# Patient Record
Sex: Female | Born: 1950 | Race: White | Hispanic: No | Smoking: Former smoker
Health system: Southern US, Community
[De-identification: ages and names within clinical notes are randomized; demographics above are authoritative.]

## PROBLEM LIST (undated history)

## (undated) DIAGNOSIS — E119 Type 2 diabetes mellitus without complications: Secondary | ICD-10-CM

## (undated) DIAGNOSIS — E1010 Type 1 diabetes mellitus with ketoacidosis without coma: Secondary | ICD-10-CM

## (undated) DIAGNOSIS — J45909 Unspecified asthma, uncomplicated: Secondary | ICD-10-CM

## (undated) DIAGNOSIS — Z853 Personal history of malignant neoplasm of breast: Secondary | ICD-10-CM

## (undated) DIAGNOSIS — E039 Hypothyroidism, unspecified: Secondary | ICD-10-CM

## (undated) DIAGNOSIS — U071 COVID-19: Secondary | ICD-10-CM

## (undated) DIAGNOSIS — E785 Hyperlipidemia, unspecified: Secondary | ICD-10-CM

## (undated) DIAGNOSIS — N200 Calculus of kidney: Secondary | ICD-10-CM

## (undated) DIAGNOSIS — I10 Essential (primary) hypertension: Secondary | ICD-10-CM

## (undated) DIAGNOSIS — J449 Chronic obstructive pulmonary disease, unspecified: Secondary | ICD-10-CM

## (undated) HISTORY — DX: Type 2 diabetes mellitus without complications: E11.9

## (undated) HISTORY — PX: ABDOMINAL HYSTERECTOMY: SHX81

## (undated) HISTORY — DX: Calculus of kidney: N20.0

## (undated) HISTORY — DX: Hyperlipidemia, unspecified: E78.5

## (undated) HISTORY — PX: PARTIAL HYSTERECTOMY: SHX80

## (undated) HISTORY — PX: INNER EAR SURGERY: SHX679

## (undated) HISTORY — DX: COVID-19: U07.1

---

## 2019-02-14 NOTE — H&P (Signed)
TOTAL HIP ADMISSION H&P  Patient is admitted for right total hip arthroplasty, anterior approach.  Subjective:  Chief Complaint:   Right hip primary OA / pain  HPI: Mandy Spears, 68 y.o. female, has a history of pain and functional disability in the right hip(s) due to arthritis and patient has failed non-surgical conservative treatments for greater than 12 weeks to include NSAID's and/or analgesics, corticosteriod injections and activity modification.  Onset of symptoms was gradual starting 1+ years ago with gradually worsening course since that time.The patient noted no past surgery on the right hip(s).  Patient currently rates pain in the right hip at 8 out of 10 with activity. Patient has worsening of pain with activity and weight bearing, trendelenberg gait, pain that interfers with activities of daily living and pain with passive range of motion. Patient has evidence of periarticular osteophytes and joint space narrowing by imaging studies. This condition presents safety issues increasing the risk of falls.  There is no current active infection.  Risks, benefits and expectations were discussed with the patient.  Risks including but not limited to the risk of anesthesia, blood clots, nerve damage, blood vessel damage, failure of the prosthesis, infection and up to and including death.  Patient understand the risks, benefits and expectations and wishes to proceed with surgery.   PCP: Charlynn Court, NP  D/C Plans:       Home   Post-op Meds:       No Rx given   Tranexamic Acid:      To be given - IV   Decadron:      Is to be given  FYI:      ASA  Norco  DME:   Pt equipment  - RW  & 3-n-1  PT:   HEP  Pharmacy: [facility] [place]     Past Medical History:  Diagnosis Date  . Asthma   . COPD (chronic obstructive pulmonary disease) ([place])   . Hypertension   . Hypothyroidism     Past Surgical History:  Procedure Laterality Date  . ABDOMINAL HYSTERECTOMY      No current  facility-administered medications for this encounter.    Current Outpatient Medications  Medication Sig Dispense Refill Last Dose  . albuterol (VENTOLIN HFA) 108 (90 Base) MCG/ACT inhaler Inhale 1-2 puffs into the lungs every 6 (six) hours as needed for wheezing or shortness of breath.     Marland Kitchen atenolol (TENORMIN) 50 MG tablet Take 100 mg by mouth daily.     Marland Kitchen levothyroxine (SYNTHROID) 88 MCG tablet Take 88 mcg by mouth daily at 6 (six) AM.      . losartan-hydrochlorothiazide (HYZAAR) 50-12.5 MG tablet Take 1 tablet by mouth daily.     . meloxicam (MOBIC) 15 MG tablet Take 15 mg by mouth daily.     . pravastatin (PRAVACHOL) 80 MG tablet Take 80 mg by mouth daily.      Allergies  Allergen Reactions  . Fish Oil Nausea And Vomiting     Social History   Tobacco Use  . Smoking status: Former Smoker    Packs/day: 2.00    Years: 15.00    Pack years: 30.00    Types: Cigarettes    Quit date: 12/29/2017    Years since quitting: 1.1  . Smokeless tobacco: Never Used  Substance Use Topics  . Alcohol use: Never    Frequency: Never       Review of Systems  Constitutional: Negative.   HENT: Negative.  Eyes: Negative.   Respiratory: Positive for shortness of breath (with exertion).   Cardiovascular: Negative.   Gastrointestinal: Negative.   Genitourinary: Positive for frequency.  Musculoskeletal: Positive for joint pain.  Skin: Negative.   Neurological: Negative.   Endo/Heme/Allergies: Negative.   Psychiatric/Behavioral: Negative.     Objective:  Physical Exam  Constitutional: She is oriented to person, place, and time. She appears well-developed.  HENT:  Head: Normocephalic.  Eyes: Pupils are equal, round, and reactive to light.  Neck: Neck supple. No JVD present. No tracheal deviation present. No thyromegaly present.  Cardiovascular: Normal rate, regular rhythm and intact distal pulses.  Respiratory: Effort normal and breath sounds normal. No respiratory distress. She has no  wheezes.  GI: Soft. There is no abdominal tenderness. There is no guarding.  Musculoskeletal:     Right hip: She exhibits decreased range of motion, decreased strength, tenderness and bony tenderness. She exhibits no swelling, no deformity and no laceration.  Lymphadenopathy:    She has no cervical adenopathy.  Neurological: She is alert and oriented to person, place, and time.  Skin: Skin is warm and dry.  Psychiatric: She has a normal mood and affect.      Imaging Review Plain radiographs demonstrate severe degenerative joint disease of the right hip(s). The bone quality appears to be good for age and reported activity level.      Assessment/Plan:  End stage arthritis, right hip(s)  The patient history, physical examination, clinical judgement of the provider and imaging studies are consistent with end stage degenerative joint disease of the right hip(s) and total hip arthroplasty is deemed medically necessary. The treatment options including medical management, injection therapy, arthroscopy and arthroplasty were discussed at length. The risks and benefits of total hip arthroplasty were presented and reviewed. The risks due to aseptic loosening, infection, stiffness, dislocation/subluxation,  thromboembolic complications and other imponderables were discussed.  The patient acknowledged the explanation, agreed to proceed with the plan and consent was signed. Patient is being admitted for inpatient treatment for surgery, pain control, PT, OT, prophylactic antibiotics, VTE prophylaxis, progressive ambulation and ADL's and discharge planning.The patient is planning to be discharged home.     Anastasio AuerbachMatthew S. Miata Culbreth   PA-C  02/24/2019, 8:36 AM

## 2019-02-20 ENCOUNTER — Inpatient Hospital Stay (HOSPITAL_COMMUNITY): Admission: RE | Admit: 2019-02-20 | Payer: Self-pay | Source: Ambulatory Visit

## 2019-02-22 ENCOUNTER — Encounter (HOSPITAL_COMMUNITY): Payer: Self-pay

## 2019-02-22 ENCOUNTER — Other Ambulatory Visit (HOSPITAL_COMMUNITY)
Admission: RE | Admit: 2019-02-22 | Discharge: 2019-02-22 | Disposition: A | Discharge status: Home / Self Care | Payer: Medicare Other | Source: Ambulatory Visit | Attending: Orthopedic Surgery | Admitting: Orthopedic Surgery

## 2019-02-22 DIAGNOSIS — Z01812 Encounter for preprocedural laboratory examination: Secondary | ICD-10-CM | POA: Insufficient documentation

## 2019-02-22 DIAGNOSIS — M1611 Unilateral primary osteoarthritis, right hip: Secondary | ICD-10-CM | POA: Insufficient documentation

## 2019-02-22 DIAGNOSIS — Z20828 Contact with and (suspected) exposure to other viral communicable diseases: Secondary | ICD-10-CM | POA: Diagnosis not present

## 2019-02-22 LAB — SARS CORONAVIRUS 2 (TAT 6-24 HRS): SARS Coronavirus 2: NEGATIVE

## 2019-02-22 NOTE — Patient Instructions (Addendum)
ONCE YOUR COVID TEST IS COMPLETED, PLEASE BEGIN THE QUARANTINE INSTRUCTIONS AS OUTLINED IN YOUR HANDOUT.                Mandy Spears    Your procedure is scheduled on: Thursday 02/25/19   Report to [facility] Main  Entrance  Report to admitting at 6:07 AM   1 VISITOR IS ALLOWED TO WAIT IN WAITING ROOM  ONLY DAY OF YOUR SURGERY.  NO VISITORS ARE ALLOWED IN SHORT STAY OR RECOVERY ROOM.   Call this number if you have problems the morning of surgery [phone]    Mandy Spears, NO CHEWING GUM CANDY OR MINTS   Do not eat food After Midnight.  YOU MAY HAVE CLEAR LIQUIDS FROM MIDNIGHT UNTIL 4:30AM.  At 4:30AM Please finish the prescribed Pre-Surgery Gatorade drink. Nothing by mouth after you finish the Gatorade drink !   Take these medicines the morning of surgery with A SIP OF WATER: Atenolol., Levothyroxine,  use Albuterol inhaler if needed and bring with you to the hospital                                You may not have any metal on your body including hair pins and              piercings              Do not wear jewelry, make-up, lotions, powders or perfumes, deodorant             Do not wear nail polish.  Do not shave  48 hours prior to surgery.              Do not bring valuables to the hospital. Mandy Spears.  Contacts, dentures or bridgework may not be worn into surgery.                    Please read over the following fact sheets you were given: _____________________________________________________________________             [facility] - Preparing for Surgery Before surgery, you can play an important role.   Because skin is not sterile, your skin needs to be as free of germs as possible.   You can reduce the number of germs on your skin by washing with CHG (chlorahexidine gluconate) soap before surgery.   CHG is an antiseptic cleaner which kills germs and  bonds with the skin to continue killing germs even after washing. Please DO NOT use if you have an allergy to CHG or antibacterial soaps.   If your skin becomes reddened/irritated stop using the CHG and inform your nurse when you arrive at Short Stay. Do not shave (including legs and underarms) for at least 48 hours prior to the first CHG shower.   Please follow these instructions carefully:  1.  Shower with CHG Soap the night before surgery and the  morning of Surgery.  2.  If you choose to wash your hair, wash your hair first as usual with your  normal  shampoo.  3.  After you shampoo, rinse your hair and body thoroughly to remove the  shampoo.  4.  Use CHG as you would any other liquid soap.  You can apply chg directly  to the skin and wash                       Gently with a scrungie or clean washcloth.  5.  Apply the CHG Soap to your body ONLY FROM THE NECK DOWN.   Do not use on face/ open                           Wound or open sores. Avoid contact with eyes, ears mouth and genitals (private parts).                       Wash face,  Genitals (private parts) with your normal soap.             6.  Wash thoroughly, paying special attention to the area where your surgery  will be performed.  7.  Thoroughly rinse your body with warm water from the neck down.  8.  DO NOT shower/wash with your normal soap after using and rinsing off  the CHG Soap.             9.  Pat yourself dry with a clean towel.            10.  Wear clean pajamas.            11.  Place clean sheets on your bed the night of your first shower and do not  sleep with pets. Day of Surgery : Do not apply any lotions/deodorants the morning of surgery.  Please wear clean clothes to the hospital/surgery center.  FAILURE TO FOLLOW THESE INSTRUCTIONS MAY RESULT IN THE CANCELLATION OF YOUR SURGERY PATIENT SIGNATURE_________________________________  NURSE  SIGNATURE__________________________________  ________________________________________________________________________   Mandy Spears  An incentive spirometer is a tool that can help keep your lungs clear and active. This tool measures how well you are filling your lungs with each breath. Taking long deep breaths may help reverse or decrease the chance of developing breathing (pulmonary) problems (especially infection) following:  A long period of time when you are unable to move or be active. BEFORE THE PROCEDURE   If the spirometer includes an indicator to show your best effort, your nurse or respiratory therapist will set it to a desired goal.  If possible, sit up straight or lean slightly forward. Try not to slouch.  Hold the incentive spirometer in an upright position. INSTRUCTIONS FOR USE  1. Sit on the edge of your bed if possible, or sit up as far as you can in bed or on a chair. 2. Hold the incentive spirometer in an upright position. 3. Breathe out normally. 4. Place the mouthpiece in your mouth and seal your lips tightly around it. 5. Breathe in slowly and as deeply as possible, raising the piston or the ball toward the top of the column. 6. Hold your breath for 3-5 seconds or for as long as possible. Allow the piston or ball to fall to the bottom of the column. 7. Remove the mouthpiece from your mouth and breathe out normally. 8. Rest for a few seconds and repeat Steps 1 through 7 at least 10 times every 1-2 hours when you are awake. Take your time and take a few normal breaths between deep breaths. 9. The spirometer may include an indicator to show your best effort.  Use the indicator as a goal to work toward during each repetition. 10. After each set of 10 deep breaths, practice coughing to be sure your lungs are clear. If you have an incision (the cut made at the time of surgery), support your incision when coughing by placing a pillow or rolled up towels firmly  against it. Once you are able to get out of bed, walk around indoors and cough well. You may stop using the incentive spirometer when instructed by your caregiver.  RISKS AND COMPLICATIONS  Take your time so you do not get dizzy or light-headed.  If you are in pain, you may need to take or ask for pain medication before doing incentive spirometry. It is harder to take a deep breath if you are having pain. AFTER USE  Rest and breathe slowly and easily.  It can be helpful to keep track of a log of your progress. Your caregiver can provide you with a simple table to help with this. If you are using the spirometer at home, follow these instructions: [place] IF:   You are having difficultly using the spirometer.  You have trouble using the spirometer as often as instructed.  Your pain medication is not giving enough relief while using the spirometer.  You develop fever of 100.5 F (38.1 C) or higher. SEEK IMMEDIATE MEDICAL CARE IF:   You cough up bloody sputum that had not been present before.  You develop fever of 102 F (38.9 C) or greater.  You develop worsening pain at or near the incision site. MAKE SURE YOU:   Understand these instructions.  Will watch your condition.  Will get help right away if you are not doing well or get worse. Document Released: 10/28/2006 Document Revised: 09/09/2011 Document Reviewed: 12/29/2006 [facility] Patient Information 2014 [place], [place].   ________________________________________________________________________

## 2019-02-23 ENCOUNTER — Encounter (HOSPITAL_COMMUNITY)
Admission: RE | Admit: 2019-02-23 | Discharge: 2019-02-23 | Disposition: A | Discharge status: Home / Self Care | Payer: Medicare Other | Source: Ambulatory Visit | Attending: Orthopedic Surgery | Admitting: Orthopedic Surgery

## 2019-02-23 ENCOUNTER — Encounter (HOSPITAL_COMMUNITY): Payer: Self-pay

## 2019-02-23 ENCOUNTER — Other Ambulatory Visit: Payer: Self-pay

## 2019-02-23 DIAGNOSIS — Z791 Long term (current) use of non-steroidal anti-inflammatories (NSAID): Secondary | ICD-10-CM | POA: Diagnosis not present

## 2019-02-23 DIAGNOSIS — I10 Essential (primary) hypertension: Secondary | ICD-10-CM | POA: Insufficient documentation

## 2019-02-23 DIAGNOSIS — Z79890 Hormone replacement therapy (postmenopausal): Secondary | ICD-10-CM | POA: Diagnosis not present

## 2019-02-23 DIAGNOSIS — E039 Hypothyroidism, unspecified: Secondary | ICD-10-CM | POA: Diagnosis not present

## 2019-02-23 DIAGNOSIS — Z87891 Personal history of nicotine dependence: Secondary | ICD-10-CM | POA: Diagnosis not present

## 2019-02-23 DIAGNOSIS — J449 Chronic obstructive pulmonary disease, unspecified: Secondary | ICD-10-CM | POA: Diagnosis not present

## 2019-02-23 DIAGNOSIS — Z79899 Other long term (current) drug therapy: Secondary | ICD-10-CM | POA: Diagnosis not present

## 2019-02-23 DIAGNOSIS — M1611 Unilateral primary osteoarthritis, right hip: Secondary | ICD-10-CM | POA: Insufficient documentation

## 2019-02-23 DIAGNOSIS — M25751 Osteophyte, right hip: Secondary | ICD-10-CM | POA: Diagnosis not present

## 2019-02-23 DIAGNOSIS — Z01812 Encounter for preprocedural laboratory examination: Secondary | ICD-10-CM | POA: Insufficient documentation

## 2019-02-23 HISTORY — DX: Hypothyroidism, unspecified: E03.9

## 2019-02-23 HISTORY — DX: Essential (primary) hypertension: I10

## 2019-02-23 HISTORY — DX: Unspecified asthma, uncomplicated: J45.909

## 2019-02-23 HISTORY — DX: Chronic obstructive pulmonary disease, unspecified: J44.9

## 2019-02-23 LAB — BASIC METABOLIC PANEL
Anion gap: 10 (ref 5–15)
BUN: 23 mg/dL (ref 8–23)
CO2: 26 mmol/L (ref 22–32)
Calcium: 9.1 mg/dL (ref 8.9–10.3)
Chloride: 103 mmol/L (ref 98–111)
Creatinine, Ser: 1.06 mg/dL — ABNORMAL HIGH (ref 0.44–1.00)
GFR calc Af Amer: >60 mL/min (ref >60)
GFR calc non Af Amer: 54 mL/min — ABNORMAL LOW (ref >60)
Glucose, Bld: 97 mg/dL (ref 70–99)
Potassium: 3.9 mmol/L (ref 3.5–5.1)
Sodium: 139 mmol/L (ref 135–145)

## 2019-02-23 LAB — SURGICAL PCR SCREEN
MRSA, PCR: NEGATIVE
Staphylococcus aureus: POSITIVE — AB

## 2019-02-23 LAB — ABO/RH: ABO/RH(D): A POS

## 2019-02-23 NOTE — Progress Notes (Signed)
PCP - Mertha Baars Cardiologist - none  Chest x-ray - no EKG - req Stress Test -  ECHO -  Cardiac Cath -   Sleep Study -no  CPAP -   Fasting Blood Sugar - na Checks Blood Sugar _____ times a day  Blood Thinner Instructions:NA Aspirin Instructions: Last Dose:  Anesthesia review:   Patient denies shortness of breath, fever, cough and chest pain at PAT appointmentYes   Patient verbalized understanding of instructions that were given to them at the PAT appointment. Patient was also instructed that they will need to review over the PAT instructions again at home before surgery.Yes

## 2019-02-25 ENCOUNTER — Observation Stay (HOSPITAL_COMMUNITY): Payer: Medicare Other

## 2019-02-25 ENCOUNTER — Inpatient Hospital Stay (HOSPITAL_COMMUNITY): Payer: Medicare Other

## 2019-02-25 ENCOUNTER — Inpatient Hospital Stay (HOSPITAL_COMMUNITY): Payer: Medicare Other | Admitting: Anesthesiology

## 2019-02-25 ENCOUNTER — Encounter (HOSPITAL_COMMUNITY): Payer: Self-pay | Admitting: *Deleted

## 2019-02-25 ENCOUNTER — Other Ambulatory Visit: Payer: Self-pay

## 2019-02-25 ENCOUNTER — Encounter (HOSPITAL_COMMUNITY)
Admission: RE | Disposition: A | Discharge status: Home / Self Care | Payer: Self-pay | Source: Home / Self Care | Attending: Orthopedic Surgery

## 2019-02-25 ENCOUNTER — Inpatient Hospital Stay (HOSPITAL_COMMUNITY): Payer: Medicare Other | Admitting: Physician Assistant

## 2019-02-25 ENCOUNTER — Observation Stay (HOSPITAL_COMMUNITY)
Admission: RE | Admit: 2019-02-25 | Discharge: 2019-02-26 | Disposition: A | Discharge status: Home / Self Care | Payer: Medicare Other | Attending: Orthopedic Surgery | Admitting: Orthopedic Surgery

## 2019-02-25 DIAGNOSIS — M25751 Osteophyte, right hip: Secondary | ICD-10-CM | POA: Diagnosis not present

## 2019-02-25 DIAGNOSIS — Z79899 Other long term (current) drug therapy: Secondary | ICD-10-CM | POA: Insufficient documentation

## 2019-02-25 DIAGNOSIS — E039 Hypothyroidism, unspecified: Secondary | ICD-10-CM | POA: Insufficient documentation

## 2019-02-25 DIAGNOSIS — Z96641 Presence of right artificial hip joint: Secondary | ICD-10-CM

## 2019-02-25 DIAGNOSIS — M25551 Pain in right hip: Secondary | ICD-10-CM

## 2019-02-25 DIAGNOSIS — I10 Essential (primary) hypertension: Secondary | ICD-10-CM | POA: Diagnosis not present

## 2019-02-25 DIAGNOSIS — M1611 Unilateral primary osteoarthritis, right hip: Principal | ICD-10-CM | POA: Insufficient documentation

## 2019-02-25 DIAGNOSIS — Z87891 Personal history of nicotine dependence: Secondary | ICD-10-CM | POA: Insufficient documentation

## 2019-02-25 DIAGNOSIS — Z96649 Presence of unspecified artificial hip joint: Secondary | ICD-10-CM

## 2019-02-25 DIAGNOSIS — J449 Chronic obstructive pulmonary disease, unspecified: Secondary | ICD-10-CM | POA: Insufficient documentation

## 2019-02-25 DIAGNOSIS — E669 Obesity, unspecified: Secondary | ICD-10-CM | POA: Diagnosis present

## 2019-02-25 DIAGNOSIS — Z791 Long term (current) use of non-steroidal anti-inflammatories (NSAID): Secondary | ICD-10-CM | POA: Insufficient documentation

## 2019-02-25 DIAGNOSIS — Z79890 Hormone replacement therapy (postmenopausal): Secondary | ICD-10-CM | POA: Insufficient documentation

## 2019-02-25 HISTORY — PX: TOTAL HIP ARTHROPLASTY: SHX124

## 2019-02-25 LAB — TYPE AND SCREEN
ABO/RH(D): A POS
Antibody Screen: NEGATIVE

## 2019-02-25 SURGERY — ARTHROPLASTY, HIP, TOTAL, ANTERIOR APPROACH
Anesthesia: Spinal | Laterality: Right

## 2019-02-25 MED ORDER — PHENYLEPHRINE HCL (PRESSORS) 10 MG/ML IV SOLN
INTRAVENOUS | Status: AC
  Filled 2019-02-25: qty 1

## 2019-02-25 MED ORDER — PROPOFOL 10 MG/ML IV BOLUS
INTRAVENOUS | Status: AC
  Filled 2019-02-25: qty 40

## 2019-02-25 MED ORDER — BISACODYL 10 MG RE SUPP: 10.0000 mg | Freq: Every day | RECTAL | Status: DC | PRN

## 2019-02-25 MED ORDER — HYDROCODONE-ACETAMINOPHEN 5-325 MG PO TABS: 1.0000 | ORAL_TABLET | ORAL | Status: DC | PRN

## 2019-02-25 MED ORDER — BUPIVACAINE IN DEXTROSE 0.75-8.25 % IT SOLN
INTRATHECAL | Status: DC | PRN
  Administered 2019-02-25: 1.8 mL via INTRATHECAL

## 2019-02-25 MED ORDER — PHENOL 1.4 % MT LIQD
1.0000 | OROMUCOSAL | Status: DC | PRN
  Filled 2019-02-25: qty 177

## 2019-02-25 MED ORDER — MIDAZOLAM HCL 5 MG/5ML IJ SOLN
INTRAMUSCULAR | Status: DC | PRN
  Administered 2019-02-25 (×2): 1 mg via INTRAVENOUS

## 2019-02-25 MED ORDER — PROPOFOL 500 MG/50ML IV EMUL
INTRAVENOUS | Status: DC | PRN
  Administered 2019-02-25: 75 ug/kg/min via INTRAVENOUS

## 2019-02-25 MED ORDER — HYDROCODONE-ACETAMINOPHEN 7.5-325 MG PO TABS
1.0000 | ORAL_TABLET | ORAL | Status: DC | PRN
  Administered 2019-02-25: 1 via ORAL
  Administered 2019-02-25 – 2019-02-26 (×3): 2 via ORAL
  Filled 2019-02-25: qty 2
  Filled 2019-02-25: qty 1
  Filled 2019-02-25 (×3): qty 2

## 2019-02-25 MED ORDER — FERROUS SULFATE 325 (65 FE) MG PO TABS
325.0000 mg | ORAL_TABLET | Freq: Three times a day (TID) | ORAL | Status: DC
  Administered 2019-02-26: 10:00:00 325 mg via ORAL
  Filled 2019-02-25: qty 1

## 2019-02-25 MED ORDER — TRANEXAMIC ACID-NACL 1000-0.7 MG/100ML-% IV SOLN: 1000.0000 mg | Freq: Once | INTRAVENOUS | Status: AC

## 2019-02-25 MED ORDER — METOCLOPRAMIDE HCL 5 MG/ML IJ SOLN: 5.0000 mg | Freq: Three times a day (TID) | INTRAMUSCULAR | Status: DC | PRN

## 2019-02-25 MED ORDER — METOCLOPRAMIDE HCL 5 MG PO TABS: 5.0000 mg | ORAL_TABLET | Freq: Three times a day (TID) | ORAL | Status: DC | PRN

## 2019-02-25 MED ORDER — PRAVASTATIN SODIUM 20 MG PO TABS
80.0000 mg | ORAL_TABLET | Freq: Every day | ORAL | Status: DC
  Administered 2019-02-25: 80 mg via ORAL
  Filled 2019-02-25: qty 2
  Filled 2019-02-25: qty 4

## 2019-02-25 MED ORDER — ALUM & MAG HYDROXIDE-SIMETH 200-200-20 MG/5ML PO SUSP: 15.0000 mL | ORAL | Status: DC | PRN

## 2019-02-25 MED ORDER — TRANEXAMIC ACID-NACL 1000-0.7 MG/100ML-% IV SOLN
1000.0000 mg | INTRAVENOUS | Status: AC
  Administered 2019-02-25: 1000 mg via INTRAVENOUS
  Filled 2019-02-25: qty 100

## 2019-02-25 MED ORDER — METHOCARBAMOL 500 MG IVPB - SIMPLE MED
500.0000 mg | Freq: Four times a day (QID) | INTRAVENOUS | Status: DC | PRN
  Administered 2019-02-25: 500 mg via INTRAVENOUS
  Filled 2019-02-25: qty 50

## 2019-02-25 MED ORDER — EPHEDRINE SULFATE 50 MG/ML IJ SOLN
INTRAMUSCULAR | Status: DC | PRN
  Administered 2019-02-25: 5 mg via INTRAVENOUS
  Administered 2019-02-25: 10 mg via INTRAVENOUS

## 2019-02-25 MED ORDER — POLYETHYLENE GLYCOL 3350 17 G PO PACK
17.0000 g | PACK | Freq: Two times a day (BID) | ORAL | Status: DC
  Administered 2019-02-25 – 2019-02-26 (×2): 17 g via ORAL
  Filled 2019-02-25 (×2): qty 1

## 2019-02-25 MED ORDER — METHOCARBAMOL 500 MG PO TABS
500.0000 mg | ORAL_TABLET | Freq: Four times a day (QID) | ORAL | Status: DC | PRN
  Administered 2019-02-25 – 2019-02-26 (×2): 500 mg via ORAL
  Filled 2019-02-25 (×2): qty 1

## 2019-02-25 MED ORDER — ACETAMINOPHEN 325 MG PO TABS: 325.0000 mg | ORAL_TABLET | Freq: Four times a day (QID) | ORAL | Status: DC | PRN

## 2019-02-25 MED ORDER — HYDROMORPHONE HCL 1 MG/ML IJ SOLN
0.2500 mg | INTRAMUSCULAR | Status: DC | PRN
  Administered 2019-02-25: 0.5 mg via INTRAVENOUS

## 2019-02-25 MED ORDER — DOCUSATE SODIUM 100 MG PO CAPS
100.0000 mg | ORAL_CAPSULE | Freq: Two times a day (BID) | ORAL | Status: DC
  Administered 2019-02-25 – 2019-02-26 (×2): 100 mg via ORAL
  Filled 2019-02-25 (×2): qty 1

## 2019-02-25 MED ORDER — SODIUM CHLORIDE 0.9 % IV SOLN
INTRAVENOUS | Status: DC | PRN
  Administered 2019-02-25: 25 ug/min via INTRAVENOUS

## 2019-02-25 MED ORDER — CEFAZOLIN SODIUM-DEXTROSE 2-4 GM/100ML-% IV SOLN
2.0000 g | Freq: Four times a day (QID) | INTRAVENOUS | Status: AC
  Administered 2019-02-25 (×2): 2 g via INTRAVENOUS
  Filled 2019-02-25 (×2): qty 100

## 2019-02-25 MED ORDER — HYDROMORPHONE HCL 1 MG/ML IJ SOLN: 0.5000 mg | INTRAMUSCULAR | Status: DC | PRN

## 2019-02-25 MED ORDER — HYDROMORPHONE HCL 1 MG/ML IJ SOLN
INTRAMUSCULAR | Status: AC
  Filled 2019-02-25: qty 1

## 2019-02-25 MED ORDER — SODIUM CHLORIDE 0.9 % IV SOLN
INTRAVENOUS | Status: DC
  Administered 2019-02-25 – 2019-02-26 (×2): via INTRAVENOUS

## 2019-02-25 MED ORDER — DEXAMETHASONE SODIUM PHOSPHATE 10 MG/ML IJ SOLN
10.0000 mg | Freq: Once | INTRAMUSCULAR | Status: AC
  Administered 2019-02-26: 10 mg via INTRAVENOUS
  Filled 2019-02-25: qty 1

## 2019-02-25 MED ORDER — DEXAMETHASONE SODIUM PHOSPHATE 10 MG/ML IJ SOLN
10.0000 mg | Freq: Once | INTRAMUSCULAR | Status: AC
  Administered 2019-02-25: 10 mg via INTRAVENOUS

## 2019-02-25 MED ORDER — MIDAZOLAM HCL 2 MG/2ML IJ SOLN
INTRAMUSCULAR | Status: AC
  Filled 2019-02-25: qty 2

## 2019-02-25 MED ORDER — OXYCODONE HCL 5 MG PO TABS: 5.0000 mg | ORAL_TABLET | Freq: Once | ORAL | Status: DC | PRN

## 2019-02-25 MED ORDER — ONDANSETRON HCL 4 MG/2ML IJ SOLN
INTRAMUSCULAR | Status: DC | PRN
  Administered 2019-02-25: 4 mg via INTRAVENOUS

## 2019-02-25 MED ORDER — OXYCODONE HCL 5 MG/5ML PO SOLN: 5.0000 mg | Freq: Once | ORAL | Status: DC | PRN

## 2019-02-25 MED ORDER — DEXAMETHASONE SODIUM PHOSPHATE 10 MG/ML IJ SOLN
INTRAMUSCULAR | Status: AC
  Filled 2019-02-25: qty 1

## 2019-02-25 MED ORDER — CHLORHEXIDINE GLUCONATE 4 % EX LIQD: 60.0000 mL | Freq: Once | CUTANEOUS | Status: DC

## 2019-02-25 MED ORDER — ONDANSETRON HCL 4 MG/2ML IJ SOLN: 4.0000 mg | Freq: Four times a day (QID) | INTRAMUSCULAR | Status: DC | PRN

## 2019-02-25 MED ORDER — PROMETHAZINE HCL 25 MG/ML IJ SOLN: 6.2500 mg | INTRAMUSCULAR | Status: DC | PRN

## 2019-02-25 MED ORDER — HYDROCHLOROTHIAZIDE 12.5 MG PO CAPS
12.5000 mg | ORAL_CAPSULE | Freq: Every day | ORAL | Status: DC
  Administered 2019-02-26: 10:00:00 12.5 mg via ORAL
  Filled 2019-02-25: qty 1

## 2019-02-25 MED ORDER — CEFAZOLIN SODIUM-DEXTROSE 2-4 GM/100ML-% IV SOLN
2.0000 g | INTRAVENOUS | Status: AC
  Administered 2019-02-25: 2 g via INTRAVENOUS
  Filled 2019-02-25: qty 100

## 2019-02-25 MED ORDER — CELECOXIB 200 MG PO CAPS
200.0000 mg | ORAL_CAPSULE | Freq: Two times a day (BID) | ORAL | Status: DC
  Administered 2019-02-25: 200 mg via ORAL
  Filled 2019-02-25: qty 1

## 2019-02-25 MED ORDER — ALBUTEROL SULFATE (2.5 MG/3ML) 0.083% IN NEBU: 2.5000 mg | INHALATION_SOLUTION | Freq: Four times a day (QID) | RESPIRATORY_TRACT | Status: DC | PRN

## 2019-02-25 MED ORDER — METHOCARBAMOL 500 MG IVPB - SIMPLE MED
INTRAVENOUS | Status: AC
  Filled 2019-02-25: qty 50

## 2019-02-25 MED ORDER — PROPOFOL 500 MG/50ML IV EMUL
INTRAVENOUS | Status: DC | PRN
  Administered 2019-02-25: 20 mg via INTRAVENOUS

## 2019-02-25 MED ORDER — LOSARTAN POTASSIUM 50 MG PO TABS
50.0000 mg | ORAL_TABLET | Freq: Every day | ORAL | Status: DC
  Administered 2019-02-26: 50 mg via ORAL
  Filled 2019-02-25: qty 1

## 2019-02-25 MED ORDER — ASPIRIN 81 MG PO CHEW
81.0000 mg | CHEWABLE_TABLET | Freq: Two times a day (BID) | ORAL | Status: DC
  Administered 2019-02-25 – 2019-02-26 (×2): 81 mg via ORAL
  Filled 2019-02-25 (×2): qty 1

## 2019-02-25 MED ORDER — ATENOLOL 100 MG PO TABS
100.0000 mg | ORAL_TABLET | Freq: Every day | ORAL | Status: DC
  Administered 2019-02-26: 100 mg via ORAL
  Filled 2019-02-25: qty 2
  Filled 2019-02-25: qty 1

## 2019-02-25 MED ORDER — PROPOFOL 10 MG/ML IV BOLUS
INTRAVENOUS | Status: AC
  Filled 2019-02-25: qty 20

## 2019-02-25 MED ORDER — ONDANSETRON HCL 4 MG PO TABS: 4.0000 mg | ORAL_TABLET | Freq: Four times a day (QID) | ORAL | Status: DC | PRN

## 2019-02-25 MED ORDER — MAGNESIUM CITRATE PO SOLN: 1.0000 | Freq: Once | ORAL | Status: DC | PRN

## 2019-02-25 MED ORDER — DIPHENHYDRAMINE HCL 12.5 MG/5ML PO ELIX: 12.5000 mg | ORAL_SOLUTION | ORAL | Status: DC | PRN

## 2019-02-25 MED ORDER — LACTATED RINGERS IV SOLN
INTRAVENOUS | Status: DC
  Administered 2019-02-25 (×2): via INTRAVENOUS

## 2019-02-25 MED ORDER — MENTHOL 3 MG MT LOZG: 1.0000 | LOZENGE | OROMUCOSAL | Status: DC | PRN

## 2019-02-25 MED ORDER — LOSARTAN POTASSIUM-HCTZ 50-12.5 MG PO TABS: 1.0000 | ORAL_TABLET | Freq: Every day | ORAL | Status: DC

## 2019-02-25 MED ORDER — LEVOTHYROXINE SODIUM 88 MCG PO TABS
88.0000 ug | ORAL_TABLET | Freq: Every day | ORAL | Status: DC
  Administered 2019-02-26: 06:00:00 88 ug via ORAL
  Filled 2019-02-25: qty 1

## 2019-02-25 MED ORDER — ONDANSETRON HCL 4 MG/2ML IJ SOLN
INTRAMUSCULAR | Status: AC
  Filled 2019-02-25: qty 2

## 2019-02-25 SURGICAL SUPPLY — 48 items
BAG DECANTER FOR FLEXI CONT (MISCELLANEOUS) IMPLANT
BAG ZIPLOCK 12X15 (MISCELLANEOUS) IMPLANT
BLADE SAG 18X100X1.27 (BLADE) ×3 IMPLANT
BLADE SURG SZ10 CARB STEEL (BLADE) ×2 IMPLANT
COVER PERINEAL POST (MISCELLANEOUS) ×3 IMPLANT
COVER SURGICAL LIGHT HANDLE (MISCELLANEOUS) ×3 IMPLANT
COVER WAND RF STERILE (DRAPES) IMPLANT
CUP ACET PINNACLE SECTR 50MM (Hips) IMPLANT
DERMABOND ADVANCED (GAUZE/BANDAGES/DRESSINGS) ×2
DERMABOND ADVANCED .7 DNX12 (GAUZE/BANDAGES/DRESSINGS) ×1 IMPLANT
DRAPE STERI IOBAN 125X83 (DRAPES) ×3 IMPLANT
DRAPE U-SHAPE 47X51 STRL (DRAPES) ×6 IMPLANT
DRESSING AQUACEL AG SP 3.5X10 (GAUZE/BANDAGES/DRESSINGS) ×1 IMPLANT
DRSG AQUACEL AG ADV 3.5X 6 (GAUZE/BANDAGES/DRESSINGS) ×2 IMPLANT
DRSG AQUACEL AG SP 3.5X10 (GAUZE/BANDAGES/DRESSINGS) ×3
DURAPREP 26ML APPLICATOR (WOUND CARE) ×3 IMPLANT
ELECT BLADE TIP CTD 4 INCH (ELECTRODE) ×3 IMPLANT
ELECT REM PT RETURN 15FT ADLT (MISCELLANEOUS) ×3 IMPLANT
ELIMINATOR HOLE APEX DEPUY (Hips) ×2 IMPLANT
GLOVE BIO SURGEON STRL SZ 6 (GLOVE) ×6 IMPLANT
GLOVE BIOGEL PI IND STRL 6.5 (GLOVE) ×1 IMPLANT
GLOVE BIOGEL PI IND STRL 7.5 (GLOVE) ×1 IMPLANT
GLOVE BIOGEL PI IND STRL 8.5 (GLOVE) ×1 IMPLANT
GLOVE BIOGEL PI INDICATOR 6.5 (GLOVE) ×2
GLOVE BIOGEL PI INDICATOR 7.5 (GLOVE) ×2
GLOVE BIOGEL PI INDICATOR 8.5 (GLOVE) ×2
GLOVE ECLIPSE 8.0 STRL XLNG CF (GLOVE) ×6 IMPLANT
GLOVE ORTHO TXT STRL SZ7.5 (GLOVE) ×6 IMPLANT
GOWN STRL REUS W/TWL LRG LVL3 (GOWN DISPOSABLE) ×6 IMPLANT
GOWN STRL REUS W/TWL XL LVL3 (GOWN DISPOSABLE) ×3 IMPLANT
HEAD FEMORAL 32 CERAMIC (Hips) ×2 IMPLANT
HOLDER FOLEY CATH W/STRAP (MISCELLANEOUS) ×3 IMPLANT
KIT TURNOVER KIT A (KITS) IMPLANT
LINER ACET PNNCL PLUS4 NEUTRAL (Hips) IMPLANT
PACK ANTERIOR HIP CUSTOM (KITS) ×3 IMPLANT
PINNACLE PLUS 4 NEUTRAL (Hips) ×3 IMPLANT
PINNACLE SECTOR CUP 50MM (Hips) ×3 IMPLANT
SCREW 6.5MMX30MM (Screw) ×2 IMPLANT
STEM FEM ACTIS HIGH SZ3 (Stem) ×2 IMPLANT
SUT MNCRL AB 4-0 PS2 18 (SUTURE) ×3 IMPLANT
SUT STRATAFIX 0 PDS 27 VIOLET (SUTURE) ×3
SUT VIC AB 1 CT1 36 (SUTURE) ×9 IMPLANT
SUT VIC AB 2-0 CT1 27 (SUTURE) ×4
SUT VIC AB 2-0 CT1 TAPERPNT 27 (SUTURE) ×2 IMPLANT
SUTURE STRATFX 0 PDS 27 VIOLET (SUTURE) ×1 IMPLANT
TRAY FOLEY MTR SLVR 16FR STAT (SET/KITS/TRAYS/PACK) IMPLANT
WATER STERILE IRR 1000ML POUR (IV SOLUTION) ×3 IMPLANT
YANKAUER SUCT BULB TIP 10FT TU (MISCELLANEOUS) IMPLANT

## 2019-02-25 NOTE — Anesthesia Postprocedure Evaluation (Signed)
Anesthesia Post Note  Patient: Mandy Spears  Procedure(s) Performed: TOTAL HIP ARTHROPLASTY ANTERIOR APPROACH (Right )     Patient location during evaluation: PACU Anesthesia Type: Spinal Level of consciousness: oriented and awake and alert Pain management: pain level controlled Vital Signs Assessment: post-procedure vital signs reviewed and stable Respiratory status: spontaneous breathing and respiratory function stable Cardiovascular status: blood pressure returned to baseline and stable Postop Assessment: no headache, no backache and no apparent nausea or vomiting Anesthetic complications: no    Last Vitals:  Vitals:   02/25/19 1115 02/25/19 1200  BP: 139/64 134/63  Pulse: (!) 54 (!) 58  Resp: 15 16  Temp:  36.4 C  SpO2: 100% 98%    Last Pain:  Vitals:   02/25/19 1200  TempSrc:   PainSc: 2                  Lynda Rainwater

## 2019-02-25 NOTE — Anesthesia Procedure Notes (Signed)
Spinal  Patient location during procedure: OR Start time: 02/25/2019 8:43 AM End time: 02/25/2019 8:47 AM Staffing Anesthesiologist: Lynda Rainwater, MD Resident/CRNA: Glory Buff, CRNA Performed: resident/CRNA  Preanesthetic Checklist Completed: patient identified, site marked, surgical consent, pre-op evaluation, timeout performed, IV checked, risks and benefits discussed and monitors and equipment checked Spinal Block Patient position: sitting Prep: DuraPrep Patient monitoring: heart rate, cardiac monitor, continuous pulse ox and blood pressure Approach: midline Location: L3-4 Injection technique: single-shot Needle Needle type: Pencan  Needle gauge: 24 G Needle length: 9 cm Assessment Sensory level: T4 Additional Notes Kit date checked and verified.  Sterile prep, Skin local with 1% lidocaine, - heme, - paraesthesia, + CSF pre and post injection, patient tolerated procedure well.

## 2019-02-25 NOTE — Transfer of Care (Signed)
Immediate Anesthesia Transfer of Care Note  Patient: Mandy Spears  Procedure(s) Performed: TOTAL HIP ARTHROPLASTY ANTERIOR APPROACH (Right )  Patient Location: PACU  Anesthesia Type:MAC and Spinal  Level of Consciousness: awake, alert , oriented and patient cooperative  Airway & Oxygen Therapy: Patient Spontanous Breathing and Patient connected to face mask oxygen  Post-op Assessment: Report given to RN and Post -op Vital signs reviewed and stable  Post vital signs: Reviewed and stable  Last Vitals:  Vitals Value Taken Time  BP    Temp    Pulse    Resp    SpO2      Last Pain:  Vitals:   02/25/19 0621  TempSrc: Oral         Complications: No apparent anesthesia complications

## 2019-02-25 NOTE — Anesthesia Procedure Notes (Signed)
Date/Time: 02/25/2019 8:37 AM Performed by: Glory Buff, CRNA Oxygen Delivery Method: Simple face mask

## 2019-02-25 NOTE — Op Note (Signed)
NAME:  Mandy BumpDiane Spears                ACCOUNT NO.: [id number]      MEDICAL RECORD NO.: [id number]      [facility]      PHYSICIAN:  Mandy PalMatthew D Daryl Spears  DATE OF BIRTH:  04/19/1951     DATE OF PROCEDURE:  02/25/2019                                 OPERATIVE REPORT         PREOPERATIVE DIAGNOSIS: Right  hip osteoarthritis.      POSTOPERATIVE DIAGNOSIS:  Right hip osteoarthritis.      PROCEDURE:  Right total hip replacement through an anterior approach   utilizing DePuy THR system, component size 50mm pinnacle cup, a size 32+4 neutral   Altrex liner, a size 3 Hi Actis stem with a 32+1 delta ceramic   ball.      SURGEON:  Madlyn FrankelMatthew D. Charlann Spears, M.D.      ASSISTANT:  Mandy GinsMatthew Babish, PA-C     ANESTHESIA:  Spinal.      SPECIMENS:  None.      COMPLICATIONS:  None.      BLOOD LOSS:  250 cc     DRAINS:  None.      INDICATION OF THE PROCEDURE:  Mandy Spears is a 68 y.o. female who had   presented to office for evaluation of right hip pain.  Radiographs revealed   progressive degenerative changes with bone-on-bone   articulation of the  hip joint, including subchondral cystic changes and osteophytes.  The patient had painful limited range of   motion significantly affecting their overall quality of life and function.  The patient was failing to    respond to conservative measures including medications and/or injections and activity modification and at this point was ready   to proceed with more definitive measures.  Consent was obtained for   benefit of pain relief.  Specific risks of infection, DVT, component   failure, dislocation, neurovascular injury, and need for revision surgery were reviewed in the office as well discussion of   the anterior versus posterior approach were reviewed.     PROCEDURE IN DETAIL:  The patient was brought to operative theater.   Once adequate anesthesia, preoperative antibiotics, 2 gm of Ancef, 1 gm of Tranexamic Acid, and 10 mg of  Decadron were administered, the patient was positioned supine on the [employer] table.  Once the patient was safely positioned with adequate padding of boney prominences we predraped out the hip, and used fluoroscopy to confirm orientation of the pelvis.      The right hip was then prepped and draped from proximal iliac crest to   mid thigh with a shower curtain technique.      Time-out was performed identifying the patient, planned procedure, and the appropriate extremity.     An incision was then made 2 cm lateral to the   anterior superior iliac spine extending over the orientation of the   tensor fascia lata muscle and sharp dissection was carried down to the   fascia of the muscle.      The fascia was then incised.  The muscle belly was identified and swept   laterally and retractor placed along the superior neck.  Following   cauterization of the circumflex vessels and removing some pericapsular  fat, a second cobra retractor was placed on the inferior neck.  A T-capsulotomy was made along the line of the   superior neck to the trochanteric fossa, then extended proximally and   distally.  Tag sutures were placed and the retractors were then placed   intracapsular.  We then identified the trochanteric fossa and   orientation of my neck cut and then made a neck osteotomy with the femur on traction.  The femoral   head was removed without difficulty or complication.  Traction was let   off and retractors were placed posterior and anterior around the   acetabulum.      The labrum and foveal tissue were debrided.  I began reaming with a 45 mm   reamer and reamed up to 49 mm reamer with good bony bed preparation and a 50 mm  cup was chosen.  The final 50 mm Pinnacle cup was then impacted under fluoroscopy to confirm the depth of penetration and orientation with respect to   Abduction and forward flexion.  A screw was placed into the ilium followed by the hole eliminator.  The final   32+4  neutral Altrex liner was impacted with good visualized rim fit.  The cup was positioned anatomically within the acetabular portion of the pelvis.      At this point, the femur was rolled to 100 degrees.  Further capsule was   released off the inferior aspect of the femoral neck.  I then   released the superior capsule proximally.  With the leg in a neutral position the hook was placed laterally   along the femur under the vastus lateralis origin and elevated manually and then held in position using the hook attachment on the bed.  The leg was then extended and adducted with the leg rolled to 100   degrees of external rotation.  Retractors were placed along the medial calcar and posteriorly over the greater trochanter.  Once the proximal femur was fully   exposed, I used a box osteotome to set orientation.  I then began   broaching with the starting chili pepper broach and passed this by hand and then broached up to 3.  With the 3 broach in place I chose a high offset neck and did several trial reductions.  The offset was appropriate, leg lengths   appeared to be equal best matched with the +1 head ball trial confirmed radiographically.   Given these findings, I went ahead and dislocated the hip, repositioned all   retractors and positioned the right hip in the extended and abducted position.  The final 3 Hi Actis stem was   chosen and it was impacted down to the level of neck cut.  Based on this   and the trial reductions, a final 32+1 delta ceramic ball was chosen and   impacted onto a clean and dry trunnion, and the hip was reduced.  The   hip had been irrigated throughout the case again at this point.  I did   reapproximate the superior capsular leaflet to the anterior leaflet   using #1 Vicryl.  The fascia of the   tensor fascia lata muscle was then reapproximated using #1 Vicryl and #0 Stratafix sutures.  The   remaining wound was closed with 2-0 Vicryl and running 4-0 Monocryl.   The hip  was cleaned, dried, and dressed sterilely using Dermabond and   Aquacel dressing.  The patient was then brought   to recovery room in  stable condition tolerating the procedure well.    Mandy Gins, PA-C was present for the entirety of the case involved from   preoperative positioning, perioperative retractor management, general   facilitation of the case, as well as primary wound closure as assistant.            Madlyn Frankel Charlann Boxer, M.D.        02/25/2019 9:03 AM

## 2019-02-25 NOTE — Evaluation (Signed)
Physical Therapy Evaluation Patient Details Name: Mandy Spears MRN: [id number] DOB: 1951/01/14 Today's Date: 02/25/2019   History of Present Illness  68 yo female s/p R DA-THA on 02/25/19. PMH includes asthma, COPD, HTN.  Clinical Impression  Pt presents with R hip pain, decreased R hip strength, difficulty performing mobility task, increased time and effort to mobilize, and decreased activity tolerance. Pt to benefit from acute PT to address deficits. Pt ambulated 25 ft with RW with min guard assist, verbal cuing for form and safety provided. Pt educated on ankle pumps (20/hour) to perform this afternoon/evening to increase circulation, to pt's tolerance and limited by pain. PT to progress mobility as tolerated, and will continue to follow acutely.        Follow Up Recommendations Follow surgeon's recommendation for DC plan and follow-up therapies;Supervision for mobility/OOB    Equipment Recommendations  Rolling walker with 5" wheels;3in1 (PT)    Recommendations for Other Services       Precautions / Restrictions Precautions Precautions: Fall Restrictions Weight Bearing Restrictions: No Other Position/Activity Restrictions: WBAT      Mobility  Bed Mobility Overal bed mobility: Needs Assistance Bed Mobility: Supine to Sit     Supine to sit: HOB elevated;Min assist     General bed mobility comments: Min assist for RLE lifting and translation to EOB. Increased time and effort.  Transfers Overall transfer level: Needs assistance Equipment used: Rolling walker (2 wheeled) Transfers: Sit to/from Stand Sit to Stand: Min guard;From elevated surface         General transfer comment: Min guard for safety. Verbal cuing for hand placement when rising.  Ambulation/Gait Ambulation/Gait assistance: Min guard Gait Distance (Feet): 25 Feet Assistive device: Rolling walker (2 wheeled) Gait Pattern/deviations: Step-to pattern;Decreased step length - right;Decreased step length -  left;Decreased stance time - right;Decreased weight shift to right;Antalgic Gait velocity: decr   General Gait Details: Min guard for safety. verbal cuing for sequencing, placement in RW, turning with RW.  Stairs            Wheelchair Mobility    Modified Rankin (Stroke Patients Only)       Balance Overall balance assessment: Mild deficits observed, not formally tested                                           Pertinent Vitals/Pain Pain Assessment: 0-10 Pain Score: 5  Pain Location: R hip Pain Descriptors / Indicators: Sore Pain Intervention(s): Limited activity within patient's tolerance;Monitored during session;Premedicated before session;Repositioned;Ice applied    Home Living Family/patient expects to be discharged to:: Private residence Living Arrangements: Children(son, granddaughter) Available Help at Discharge: Family;Available 24 hours/day Type of Home: House Home Access: Stairs to enter   [employer] of Steps: 4 Home Layout: One level Home Equipment: Cane - quad      Prior Function Level of Independence: Independent with assistive device(s)         Comments: Pt reports using cane PRN PTA.     Hand Dominance   Dominant Hand: Right    Extremity/Trunk Assessment   Upper Extremity Assessment Upper Extremity Assessment: Overall WFL for tasks assessed    Lower Extremity Assessment Lower Extremity Assessment: Generalized weakness;RLE deficits/detail RLE Deficits / Details: suspected post-surgical hip weakness. Able to perform ankle pumps, quad set, heel slide RLE Sensation: WNL    Cervical / Trunk Assessment Cervical / Trunk Assessment: Normal  Communication   Communication: No difficulties  Cognition Arousal/Alertness: Awake/alert Behavior During Therapy: WFL for tasks assessed/performed Overall Cognitive Status: Within Functional Limits for tasks assessed                                         General Comments      Exercises     Assessment/Plan    PT Assessment Patient needs continued PT services  PT Problem List Decreased strength;Decreased mobility;Decreased range of motion;Decreased activity tolerance;Decreased balance;Decreased knowledge of use of DME;Pain;Obesity       PT Treatment Interventions DME instruction;Therapeutic activities;Gait training;Therapeutic exercise;Patient/family education;Balance training;Stair training;Functional mobility training    PT Goals (Current goals can be found in the Care Plan section)  Acute Rehab PT Goals Patient Stated Goal: go home PT Goal Formulation: With patient Time For Goal Achievement: 03/04/19 Potential to Achieve Goals: Good    Frequency 7X/week   Barriers to discharge        Co-evaluation               AM-PAC PT "6 Clicks" Mobility  Outcome Measure Help needed turning from your back to your side while in a flat bed without using bedrails?: A Little Help needed moving from lying on your back to sitting on the side of a flat bed without using bedrails?: A Little Help needed moving to and from a bed to a chair (including a wheelchair)?: A Little Help needed standing up from a chair using your arms (e.g., wheelchair or bedside chair)?: A Little Help needed to walk in hospital room?: A Little Help needed climbing 3-5 steps with a railing? : A Lot 6 Click Score: 17    End of Session Equipment Utilized During Treatment: Gait belt Activity Tolerance: Patient tolerated treatment well;Patient limited by pain Patient left: in chair;with chair alarm set;with call bell/phone within reach;with SCD's reapplied Nurse Communication: Mobility status PT Visit Diagnosis: Other abnormalities of gait and mobility (R26.89);Difficulty in walking, not elsewhere classified (R26.2)    Time: [id number] PT Time Calculation (min) (ACUTE ONLY): 23 min   Charges:   PT Evaluation $PT Eval Low Complexity: 1 Low PT  Treatments $Gait Training: 8-22 mins        Nicola PoliceAlexa D Shamanda Len, PT Acute Rehabilitation Services Pager [phone]  Office [phone]  Tyrone AppleAlexa D Despina Hiddenure 02/25/2019, 7:28 PM

## 2019-02-25 NOTE — Anesthesia Preprocedure Evaluation (Addendum)
Anesthesia Evaluation  Patient identified by MRN, date of birth, ID band Patient awake    Reviewed: Allergy & Precautions, NPO status , Patient's Chart, lab work & pertinent test results  Airway Mallampati: II  TM Distance: >3 FB Neck ROM: Full    Dental no notable dental hx.    Pulmonary asthma , COPD, former smoker,    Pulmonary exam normal breath sounds clear to auscultation       Cardiovascular hypertension, Pt. on medications negative cardio ROS Normal cardiovascular exam Rhythm:Regular Rate:Normal     Neuro/Psych negative neurological ROS  negative psych ROS   GI/Hepatic negative GI ROS, Neg liver ROS,   Endo/Other  Hypothyroidism   Renal/GU negative Renal ROS  negative genitourinary   Musculoskeletal negative musculoskeletal ROS (+)   Abdominal (+) + obese,   Peds negative pediatric ROS (+)  Hematology negative hematology ROS (+)   Anesthesia Other Findings   Reproductive/Obstetrics negative OB ROS                             Anesthesia Physical Anesthesia Plan  ASA: II  Anesthesia Plan: Spinal   Post-op Pain Management:    Induction: Intravenous  PONV Risk Score and Plan: 2 and Ondansetron, Midazolam and Treatment may vary due to age or medical condition  Airway Management Planned: Simple Face Mask  Additional Equipment:   Intra-op Plan:   Post-operative Plan:   Informed Consent: I have reviewed the patients History and Physical, chart, labs and discussed the procedure including the risks, benefits and alternatives for the proposed anesthesia with the patient or authorized representative who has indicated his/her understanding and acceptance.     Dental advisory given  Plan Discussed with: CRNA  Anesthesia Plan Comments:         Anesthesia Quick Evaluation

## 2019-02-25 NOTE — Discharge Instructions (Signed)

## 2019-02-25 NOTE — Interval H&P Note (Signed)
History and Physical Interval Note:  02/25/2019 6:57 AM  Mandy Spears  has presented today for surgery, with the diagnosis of Right hip osteoarthritis.  The various methods of treatment have been discussed with the patient and family. After consideration of risks, benefits and other options for treatment, the patient has consented to  Procedure(s) with comments: TOTAL HIP ARTHROPLASTY ANTERIOR APPROACH (Right) - 70 mins as a surgical intervention.  The patient's history has been reviewed, patient examined, no change in status, stable for surgery.  I have reviewed the patient's chart and labs.  Questions were answered to the patient's satisfaction.     Mauri Pole

## 2019-02-26 ENCOUNTER — Encounter (HOSPITAL_COMMUNITY): Payer: Self-pay | Admitting: Orthopedic Surgery

## 2019-02-26 DIAGNOSIS — E669 Obesity, unspecified: Secondary | ICD-10-CM | POA: Diagnosis present

## 2019-02-26 DIAGNOSIS — M1611 Unilateral primary osteoarthritis, right hip: Secondary | ICD-10-CM | POA: Diagnosis not present

## 2019-02-26 LAB — CBC
HCT: 33.2 % — ABNORMAL LOW (ref 36.0–46.0)
Hemoglobin: 10.8 g/dL — ABNORMAL LOW (ref 12.0–15.0)
MCH: 28.4 pg (ref 26.0–34.0)
MCHC: 32.5 g/dL (ref 30.0–36.0)
MCV: 87.4 fL (ref 80.0–100.0)
Platelets: 211 10*3/uL (ref 150–400)
RBC: 3.8 MIL/uL — ABNORMAL LOW (ref 3.87–5.11)
RDW: 13.9 % (ref 11.5–15.5)
WBC: 13.3 10*3/uL — ABNORMAL HIGH (ref 4.0–10.5)
nRBC: 0 % (ref 0.0–0.2)

## 2019-02-26 LAB — BASIC METABOLIC PANEL
Anion gap: 10 (ref 5–15)
BUN: 25 mg/dL — ABNORMAL HIGH (ref 8–23)
CO2: 22 mmol/L (ref 22–32)
Calcium: 8.1 mg/dL — ABNORMAL LOW (ref 8.9–10.3)
Chloride: 105 mmol/L (ref 98–111)
Creatinine, Ser: 1.22 mg/dL — ABNORMAL HIGH (ref 0.44–1.00)
GFR calc Af Amer: 53 mL/min — ABNORMAL LOW (ref >60)
GFR calc non Af Amer: 46 mL/min — ABNORMAL LOW (ref >60)
Glucose, Bld: 217 mg/dL — ABNORMAL HIGH (ref 70–99)
Potassium: 3.8 mmol/L (ref 3.5–5.1)
Sodium: 137 mmol/L (ref 135–145)

## 2019-02-26 MED ORDER — ASPIRIN 81 MG PO CHEW: 81.0000 mg | CHEWABLE_TABLET | Freq: Two times a day (BID) | ORAL | 0 refills | Status: AC

## 2019-02-26 MED ORDER — POLYETHYLENE GLYCOL 3350 17 G PO PACK: 17.0000 g | PACK | Freq: Two times a day (BID) | ORAL | 0 refills | Status: DC

## 2019-02-26 MED ORDER — HYDROCODONE-ACETAMINOPHEN 7.5-325 MG PO TABS: 1.0000 | ORAL_TABLET | ORAL | 0 refills | Status: DC | PRN

## 2019-02-26 MED ORDER — DOCUSATE SODIUM 100 MG PO CAPS: 100.0000 mg | ORAL_CAPSULE | Freq: Two times a day (BID) | ORAL | 0 refills | Status: DC

## 2019-02-26 MED ORDER — METHOCARBAMOL 500 MG PO TABS: 500.0000 mg | ORAL_TABLET | Freq: Four times a day (QID) | ORAL | 0 refills | Status: DC | PRN

## 2019-02-26 MED ORDER — FERROUS SULFATE 325 (65 FE) MG PO TABS: 325.0000 mg | ORAL_TABLET | Freq: Three times a day (TID) | ORAL | 0 refills | Status: DC

## 2019-02-26 NOTE — Progress Notes (Signed)
Patient discharged to home w/ family. Given all belongings, instructions, equipment. Verbalized understanding of all instructions. Escorted to pov via w/c. 

## 2019-02-26 NOTE — Progress Notes (Signed)
Physical Therapy Treatment Patient Details Name: Mandy Spears MRN: [id number] DOB: 07/16/1950 Today's Date: 02/26/2019    History of Present Illness 68 yo female s/p R DA-THA on 02/25/19. PMH includes asthma, COPD, HTN.    PT Comments    Pt ambulated again in hallway and practiced safe stair technique.  Pt reports her head "feeling funny" this afternoon.  Pt denied dizziness with mobility however described "foggy" headed and trouble thinking clearly.  (RN notifie) Pt did not require assist for mobility.  Pt anticipates d/c home later today.  Pt provided with HEP and had no further questions.    Follow Up Recommendations  Follow surgeon's recommendation for DC plan and follow-up therapies;Supervision for mobility/OOB     Equipment Recommendations  Rolling walker with 5" wheels;3in1 (PT)    Recommendations for Other Services       Precautions / Restrictions Precautions Precautions: Fall Restrictions Other Position/Activity Restrictions: WBAT    Mobility  Bed Mobility Overal bed mobility: Needs Assistance Bed Mobility: Supine to Sit;Sit to Supine     Supine to sit: Supervision Sit to supine: Supervision      Transfers Overall transfer level: Needs assistance Equipment used: Rolling walker (2 wheeled) Transfers: Sit to/from Stand Sit to Stand: Supervision         General transfer comment: Verbal cuing for hand placement  Ambulation/Gait Ambulation/Gait assistance: Min guard Gait Distance (Feet): 200 Feet Assistive device: Rolling walker (2 wheeled) Gait Pattern/deviations: Step-to pattern;Decreased stance time - right;Antalgic;Step-through pattern Gait velocity: decr   General Gait Details: Min guard for safety. verbal cuing for sequencing, placement in RW, turning with RW.   Stairs Stairs: Yes Stairs assistance: Min guard Stair Management: Step to pattern;Forwards;Two rails;One rail Left Number of Stairs: 4 General stair comments: verbal cues for  sequencing, pt performed first with 2 rails and then again with only 1 rail; cues for sequencing provided both times   Wheelchair Mobility    Modified Rankin (Stroke Patients Only)       Balance                                            Cognition Arousal/Alertness: Awake/alert Behavior During Therapy: WFL for tasks assessed/performed Overall Cognitive Status: Within Functional Limits for tasks assessed                                        Exercises   General Comments        Pertinent Vitals/Pain Pain Assessment: 0-10 Pain Score: 4  Pain Location: R hip Pain Descriptors / Indicators: Sore;Aching Pain Intervention(s): Limited activity within patient's tolerance;Monitored during session;Repositioned    Home Living                      Prior Function            PT Goals (current goals can now be found in the care plan section) Progress towards PT goals: Progressing toward goals    Frequency    7X/week      PT Plan Current plan remains appropriate    Co-evaluation              AM-PAC PT "6 Clicks" Mobility   Outcome Measure  Help needed turning from your back to your side while  in a flat bed without using bedrails?: A Little Help needed moving from lying on your back to sitting on the side of a flat bed without using bedrails?: A Little Help needed moving to and from a bed to a chair (including a wheelchair)?: A Little Help needed standing up from a chair using your arms (e.g., wheelchair or bedside chair)?: A Little Help needed to walk in hospital room?: A Little Help needed climbing 3-5 steps with a railing? : A Little 6 Click Score: 18    End of Session Equipment Utilized During Treatment: Gait belt Activity Tolerance: Patient tolerated treatment well Patient left: with call bell/phone within reach;in bed Nurse Communication: Mobility status PT Visit Diagnosis: Other abnormalities of gait and  mobility (R26.89);Difficulty in walking, not elsewhere classified (R26.2)     Time: 2549-8264 PT Time Calculation (min) (ACUTE ONLY): 26 min  Charges:  $Gait Training: 23-37 mins                    Carmelia Bake, PT, DPT Acute Rehabilitation Services Office: [phone] Pager: [phone]  Mandy Spears 02/26/2019, 3:15 PM

## 2019-02-26 NOTE — Progress Notes (Signed)
     Subjective: 1 Day Post-Op Procedure(s) (LRB): TOTAL HIP ARTHROPLASTY ANTERIOR APPROACH (Right)   Patient reports pain as mild, pain controlled with medication.  No reported events throughout the night.  Dr. Ihor Gully discussed the findings, procedure and expectations moving forward.  Patient is ready to be discharged home, if they do well with therapy.  Follow-up in the clinic in 2 weeks.     Objective:   VITALS:   Vitals:   02/26/19 0009 02/26/19 0408  BP: 112/62 (!) 113/55  Pulse: 63 60  Resp: 16 16  Temp: 97.7 F (36.5 C) 97.8 F (36.6 C)  SpO2: 98% 99%    Dorsiflexion/Plantar flexion intact Incision: dressing C/D/I No cellulitis present Compartment soft  LABS Recent Labs    02/26/19 0221  HGB 10.8*  HCT 33.2*  WBC 13.3*  PLT 211    Recent Labs    02/23/19 1008 02/26/19 0221  NA 139 137  K 3.9 3.8  BUN 23 25*  CREATININE 1.06* 1.22*  GLUCOSE 97 217*     Assessment/Plan: 1 Day Post-Op Procedure(s) (LRB): TOTAL HIP ARTHROPLASTY ANTERIOR APPROACH (Right) Foley cath d/c'ed Advance diet Up with therapy D/C IV fluids Discharge home Follow up in 2 weeks at [facility] ([place]). Follow up with OLIN,Kandy Towery D in 2 weeks.  Contact information:  [facility]) [address], [place] [phone]     Obese (BMI 30-39.9) Estimated body mass index is 35.22 kg/m as calculated from the following:   Height as of this encounter: 5\' 5"  (1.651 m).   Weight as of this encounter: 96 kg. Patient also counseled that weight may inhibit the healing process Patient counseled that losing weight will help with future health issues       [place] Pugh. Shaheem Pichon   PAC  02/26/2019, 7:58 AM

## 2019-02-26 NOTE — Progress Notes (Signed)
Physical Therapy Treatment Patient Details Name: Mandy Spears MRN: [id number] DOB: 04/04/1951 Today's Date: 02/26/2019    History of Present Illness 68 yo female s/p R DA-THA on 02/25/19. PMH includes asthma, COPD, HTN.    PT Comments    Pt ambulated in hallway and performed LE exercises.  Pt reports more pain and soreness compared to yesterday.  Pt to d/c home later today so will return to practice steps.   Follow Up Recommendations  Follow surgeon's recommendation for DC plan and follow-up therapies;Supervision for mobility/OOB     Equipment Recommendations  Rolling walker with 5" wheels;3in1 (PT)    Recommendations for Other Services       Precautions / Restrictions Precautions Precautions: Fall Restrictions Weight Bearing Restrictions: No Other Position/Activity Restrictions: WBAT    Mobility  Bed Mobility Overal bed mobility: Needs Assistance Bed Mobility: Supine to Sit     Supine to sit: Supervision        Transfers Overall transfer level: Needs assistance Equipment used: Rolling walker (2 wheeled) Transfers: Sit to/from Stand Sit to Stand: Min guard         General transfer comment: Min guard for safety. Verbal cuing for hand placement  Ambulation/Gait Ambulation/Gait assistance: Min guard Gait Distance (Feet): 100 Feet Assistive device: Rolling walker (2 wheeled) Gait Pattern/deviations: Step-to pattern;Decreased stance time - right;Antalgic Gait velocity: decr   General Gait Details: Min guard for safety. verbal cuing for sequencing, placement in RW, turning with RW.   Stairs             Wheelchair Mobility    Modified Rankin (Stroke Patients Only)       Balance                                            Cognition Arousal/Alertness: Awake/alert Behavior During Therapy: WFL for tasks assessed/performed Overall Cognitive Status: Within Functional Limits for tasks assessed                                         Exercises Total Joint Exercises Ankle Circles/Pumps: AROM;Both;10 reps Quad Sets: AROM;Both;10 reps Heel Slides: AAROM;10 reps;Right Hip ABduction/ADduction: AROM;Supine;10 reps;Right;Standing Long Arc Quad: AROM;Right;Seated;10 reps Knee Flexion: AROM;Right;10 reps;Standing Marching in Standing: AROM;Right;10 reps Standing Hip Extension: AROM;Right;Standing;10 reps    General Comments        Pertinent Vitals/Pain Pain Assessment: 0-10 Pain Score: 5  Pain Location: R hip Pain Descriptors / Indicators: Sore;Aching Pain Intervention(s): Monitored during session;Repositioned    Home Living                      Prior Function            PT Goals (current goals can now be found in the care plan section) Progress towards PT goals: Progressing toward goals    Frequency    7X/week      PT Plan Current plan remains appropriate    Co-evaluation              AM-PAC PT "6 Clicks" Mobility   Outcome Measure  Help needed turning from your back to your side while in a flat bed without using bedrails?: A Little Help needed moving from lying on your back to sitting on the side of a flat  bed without using bedrails?: A Little Help needed moving to and from a bed to a chair (including a wheelchair)?: A Little Help needed standing up from a chair using your arms (e.g., wheelchair or bedside chair)?: A Little Help needed to walk in hospital room?: A Little Help needed climbing 3-5 steps with a railing? : A Lot 6 Click Score: 17    End of Session Equipment Utilized During Treatment: Gait belt Activity Tolerance: Patient tolerated treatment well Patient left: in chair;with call bell/phone within reach   PT Visit Diagnosis: Other abnormalities of gait and mobility (R26.89);Difficulty in walking, not elsewhere classified (R26.2)     Time: 5797-2820 PT Time Calculation (min) (ACUTE ONLY): 20 min  Charges:  $Therapeutic Exercise: 8-22  mins                     Carmelia Bake, PT, DPT Acute Rehabilitation Services Office: [phone] Pager: [phone]    Trena Platt 02/26/2019, 12:02 PM

## 2019-02-26 NOTE — Progress Notes (Signed)
Reviewed all d/c instructions w/ patient. Patient verbalized understanding. Will d/c home w/ belongings, instructions and equipment. Awaiting ride for home. Will cont to monitor until d/c.

## 2019-03-01 NOTE — Discharge Summary (Signed)
Physician Discharge Summary  Patient ID: Mandy Spears Witting MRN: [id number] DOB/AGE: 68/08/1950 68 y.o.  Admit date: 02/25/2019 Discharge date: 02/26/2019   Procedures:  Procedure(s) (LRB): TOTAL HIP ARTHROPLASTY ANTERIOR APPROACH (Right)  Attending Physician:  Dr. Durene RomansMatthew Olin   Admission Diagnoses:   Right hip primary OA / pain  Discharge Diagnoses:  Principal Problem:   S/P right THA, AA Active Problems:   Obese  Past Medical History:  Diagnosis Date  . Asthma   . COPD (chronic obstructive pulmonary disease) (HCC)   . Hypertension   . Hypothyroidism     HPI:    Mandy Spears Pester, 68 y.o. female, has a history of pain and functional disability in the right hip(s) due to arthritis and patient has failed non-surgical conservative treatments for greater than 12 weeks to include NSAID's and/or analgesics, corticosteriod injections and activity modification.  Onset of symptoms was gradual starting 1+ years ago with gradually worsening course since that time.The patient noted no past surgery on the right hip(s).  Patient currently rates pain in the right hip at 8 out of 10 with activity. Patient has worsening of pain with activity and weight bearing, trendelenberg gait, pain that interfers with activities of daily living and pain with passive range of motion. Patient has evidence of periarticular osteophytes and joint space narrowing by imaging studies. This condition presents safety issues increasing the risk of falls.  There is no current active infection.  Risks, benefits and expectations were discussed with the patient.  Risks including but not limited to the risk of anesthesia, blood clots, nerve damage, blood vessel damage, failure of the prosthesis, infection and up to and including death.  Patient understand the risks, benefits and expectations and wishes to proceed with surgery.   PCP: Hal MoralesGunter, Tara G, NP   Discharged Condition: good  Hospital Course:  Patient underwent the above stated  procedure on 02/25/2019. Patient tolerated the procedure well and brought to the recovery room in good condition and subsequently to the floor.  POD #1 BP: 113/55 ; Pulse: 60 ; Temp: 97.8 F (36.6 C) ; Resp: 16 Patient reports pain as mild, pain controlled with medication.  No reported events throughout the night.  Dr. Constance Goltzlen discussed the findings, procedure and expectations moving forward.  Patient is ready to be discharged home Dorsiflexion/plantar flexion intact, incision: dressing C/D/I, no cellulitis present and compartment soft.   LABS  Basename    HGB     10.8  HCT     33.2    Discharge Exam: General appearance: alert, cooperative and no distress Extremities: Homans sign is negative, no sign of DVT, no edema, redness or tenderness in the calves or thighs and no ulcers, gangrene or trophic changes  Disposition:  Home with follow up in 2 weeks   Follow-up Information    Durene Romanslin, Garrin Kirwan, MD. Schedule an appointment as soon as possible for a visit in 2 weeks.   Specialty: Orthopedic Surgery Contact information: [address] [place] [address] [phone]           Discharge Instructions    Call MD / Call 911   Complete by: As directed    If you experience chest pain or shortness of breath, CALL 911 and be transported to the hospital emergency room.  If you develope a fever above 101 F, pus (white drainage) or increased drainage or redness at the wound, or calf pain, call your surgeon's office.   Change dressing   Complete by: As directed  Maintain surgical dressing until follow up in the clinic. If the edges start to pull up, may reinforce with tape. If the dressing is no longer working, may remove and cover with gauze and tape, but must keep the area dry and clean.  Call with any questions or concerns.   Constipation Prevention   Complete by: As directed    Drink plenty of fluids.  Prune juice may be helpful.  You may use a stool softener, such as  Colace (over the counter) 100 mg twice a day.  Use MiraLax (over the counter) for constipation as needed.   Diet - low sodium heart healthy   Complete by: As directed    Discharge instructions   Complete by: As directed    Maintain surgical dressing until follow up in the clinic. If the edges start to pull up, may reinforce with tape. If the dressing is no longer working, may remove and cover with gauze and tape, but must keep the area dry and clean.  Follow up in 2 weeks at [facility]. Call with any questions or concerns.   Increase activity slowly as tolerated   Complete by: As directed    Weight bearing as tolerated with assist device (walker, cane, etc) as directed, use it as long as suggested by your surgeon or therapist, typically at least 4-6 weeks.   TED hose   Complete by: As directed    Use stockings (TED hose) for 2 weeks on both leg(s).  You may remove them at night for sleeping.      Allergies as of 02/26/2019      Reactions   Fish Oil Nausea And Vomiting      Medication List    STOP taking these medications   meloxicam 15 MG tablet Commonly known as: MOBIC     TAKE these medications   albuterol 108 (90 Base) MCG/ACT inhaler Commonly known as: VENTOLIN HFA Inhale 1-2 puffs into the lungs every 6 (six) hours as needed for wheezing or shortness of breath.   aspirin 81 MG chewable tablet Commonly known as: Aspirin Childrens Chew 1 tablet (81 mg total) by mouth 2 (two) times daily. Take for 4 weeks, then resume regular dose.   atenolol 50 MG tablet Commonly known as: TENORMIN Take 100 mg by mouth daily.   docusate sodium 100 MG capsule Commonly known as: Colace Take 1 capsule (100 mg total) by mouth 2 (two) times daily.   ferrous sulfate 325 (65 FE) MG tablet Commonly known as: FerrouSul Take 1 tablet (325 mg total) by mouth 3 (three) times daily with meals for 14 days.   HYDROcodone-acetaminophen 7.5-325 MG tablet Commonly known as: Norco Take  1-2 tablets by mouth every 4 (four) hours as needed for moderate pain.   levothyroxine 88 MCG tablet Commonly known as: SYNTHROID Take 88 mcg by mouth daily at 6 (six) AM.   losartan-hydrochlorothiazide 50-12.5 MG tablet Commonly known as: HYZAAR Take 1 tablet by mouth daily.   methocarbamol 500 MG tablet Commonly known as: Robaxin Take 1 tablet (500 mg total) by mouth every 6 (six) hours as needed for muscle spasms.   polyethylene glycol 17 g packet Commonly known as: MIRALAX / GLYCOLAX Take 17 g by mouth 2 (two) times daily.   pravastatin 80 MG tablet Commonly known as: PRAVACHOL Take 80 mg by mouth daily.            Discharge Care Instructions  (From admission, onward)  Start     Ordered   02/26/19 0000  Change dressing    Comments: Maintain surgical dressing until follow up in the clinic. If the edges start to pull up, may reinforce with tape. If the dressing is no longer working, may remove and cover with gauze and tape, but must keep the area dry and clean.  Call with any questions or concerns.   02/26/19 0802           Signed: Anastasio Auerbach. Kayela Humphres   PA-C  03/01/2019, 9:44 PM

## 2019-11-04 DIAGNOSIS — U071 COVID-19: Secondary | ICD-10-CM

## 2019-11-04 DIAGNOSIS — E860 Dehydration: Secondary | ICD-10-CM

## 2019-11-04 DIAGNOSIS — J189 Pneumonia, unspecified organism: Secondary | ICD-10-CM

## 2019-11-04 DIAGNOSIS — E876 Hypokalemia: Secondary | ICD-10-CM | POA: Diagnosis not present

## 2019-11-04 DIAGNOSIS — R55 Syncope and collapse: Secondary | ICD-10-CM

## 2019-11-05 DIAGNOSIS — R55 Syncope and collapse: Secondary | ICD-10-CM | POA: Diagnosis not present

## 2019-11-05 DIAGNOSIS — J189 Pneumonia, unspecified organism: Secondary | ICD-10-CM | POA: Diagnosis not present

## 2019-11-05 DIAGNOSIS — U071 COVID-19: Secondary | ICD-10-CM | POA: Diagnosis not present

## 2019-11-05 DIAGNOSIS — E876 Hypokalemia: Secondary | ICD-10-CM | POA: Diagnosis not present

## 2019-11-06 DIAGNOSIS — U071 COVID-19: Secondary | ICD-10-CM | POA: Diagnosis not present

## 2019-11-06 DIAGNOSIS — E876 Hypokalemia: Secondary | ICD-10-CM | POA: Diagnosis not present

## 2019-11-06 DIAGNOSIS — R55 Syncope and collapse: Secondary | ICD-10-CM | POA: Diagnosis not present

## 2019-11-06 DIAGNOSIS — J189 Pneumonia, unspecified organism: Secondary | ICD-10-CM | POA: Diagnosis not present

## 2019-11-07 DIAGNOSIS — E876 Hypokalemia: Secondary | ICD-10-CM | POA: Diagnosis not present

## 2019-11-07 DIAGNOSIS — J189 Pneumonia, unspecified organism: Secondary | ICD-10-CM | POA: Diagnosis not present

## 2019-11-07 DIAGNOSIS — U071 COVID-19: Secondary | ICD-10-CM | POA: Diagnosis not present

## 2019-11-07 DIAGNOSIS — R55 Syncope and collapse: Secondary | ICD-10-CM | POA: Diagnosis not present

## 2019-11-08 DIAGNOSIS — J189 Pneumonia, unspecified organism: Secondary | ICD-10-CM | POA: Diagnosis not present

## 2019-11-08 DIAGNOSIS — E876 Hypokalemia: Secondary | ICD-10-CM | POA: Diagnosis not present

## 2019-11-08 DIAGNOSIS — R55 Syncope and collapse: Secondary | ICD-10-CM | POA: Diagnosis not present

## 2019-11-08 DIAGNOSIS — U071 COVID-19: Secondary | ICD-10-CM | POA: Diagnosis not present

## 2020-03-01 DEATH — deceased

## 2020-11-06 IMAGING — RF OPERATIVE RIGHT HIP WITH PELVIS
1 series · 2 of 2 positions shown · non-contrast
Comparison: [facility] hip MRI 11/21/2017.

CLINICAL DATA: 67-year-old female undergoing right hip
arthroplasty.

EXAM:
OPERATIVE RIGHT HIP (WITH PELVIS IF PERFORMED) 2 VIEWS
TECHNIQUE: Fluoroscopic spot image(s) were submitted for interpretation
post-operatively.

[Series 1: run · 2 of 2 slices shown]
[im 1/2]
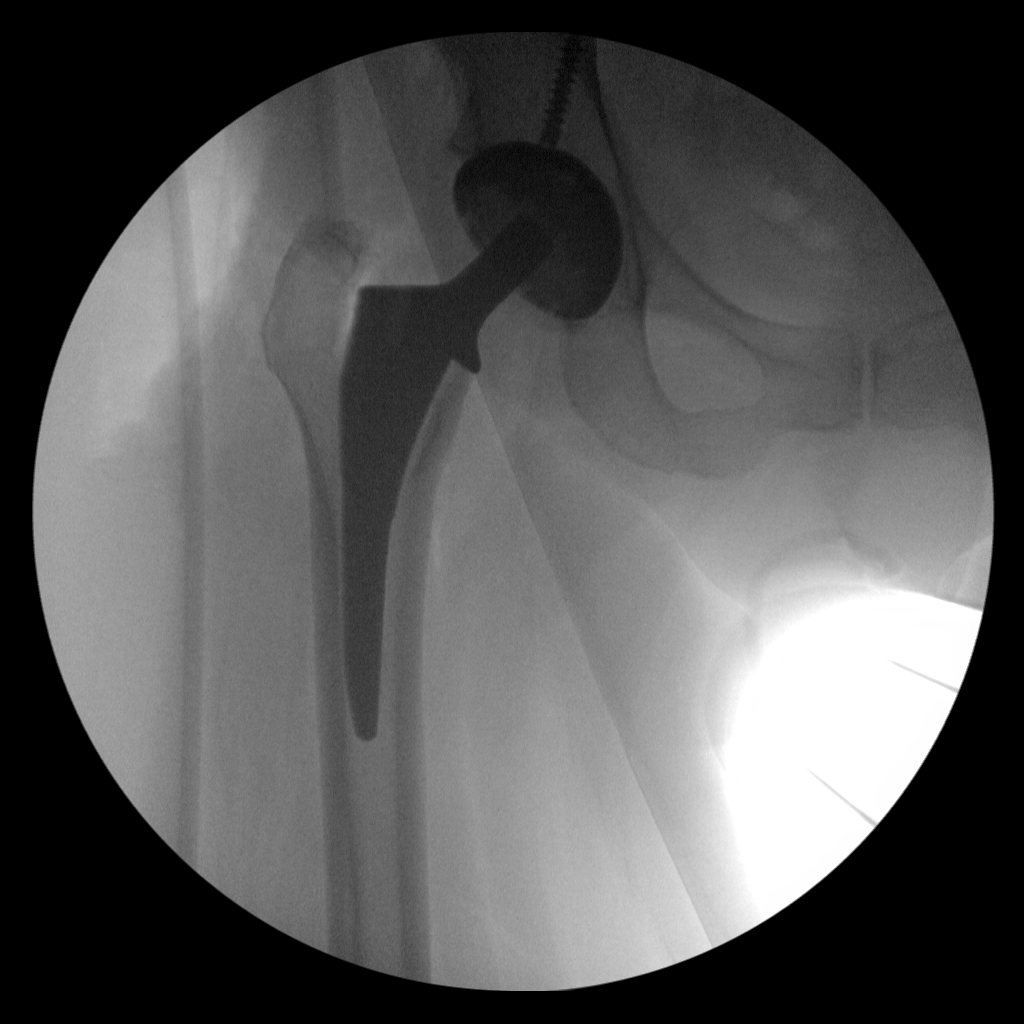
[im 2/2]
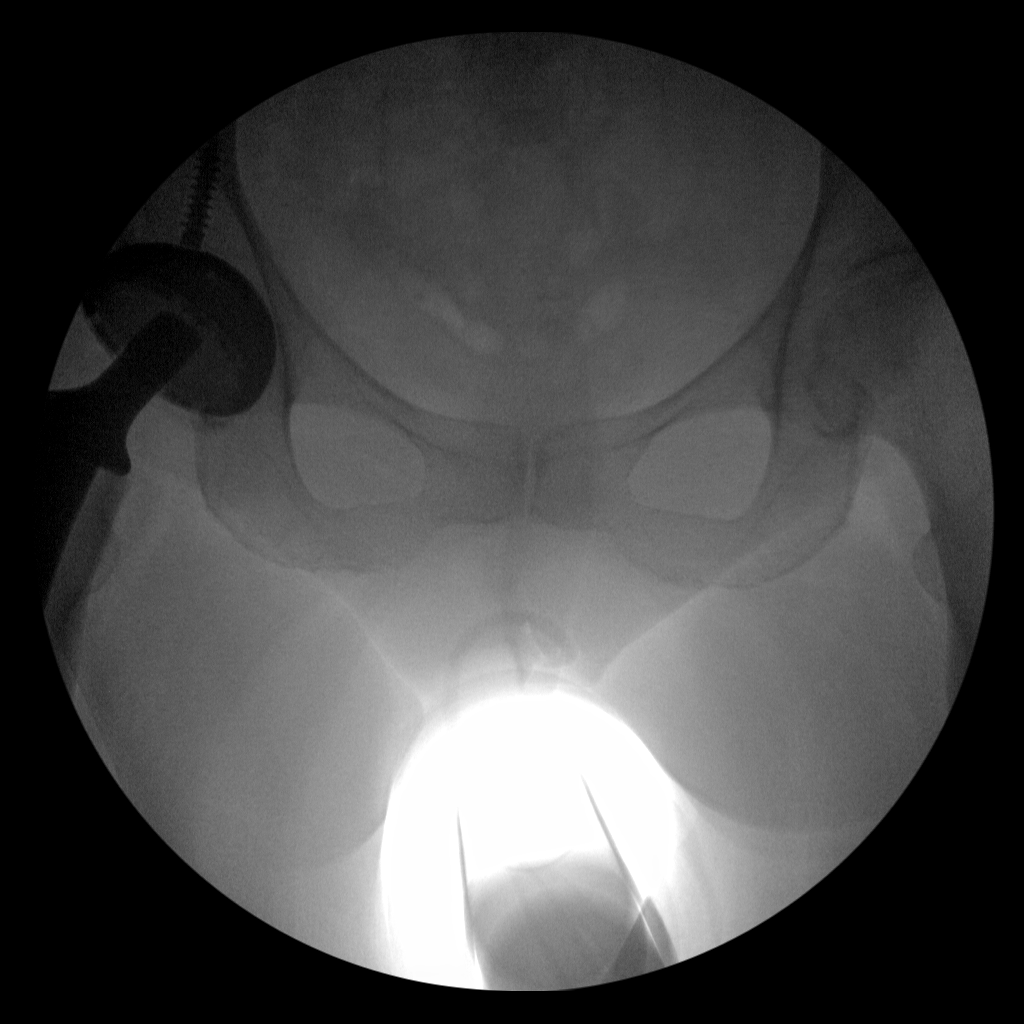

[2 of 2 positions shown; findings below may reference images not displayed]

FINDINGS: Intraoperative AP portable spot views of the right hip and lower
pelvis at 1771 hours. Right total hip arthroplasty hardware is in
place, no femoral head component at this time. No unexpected osseous
changes.

FLUOROSCOPY TIME:  0 minutes 13 seconds
IMPRESSION: Right total hip arthroplasty underway.

## 2023-06-04 LAB — HM DEXA SCAN

## 2023-06-04 LAB — HM MAMMOGRAPHY

## 2023-08-25 ENCOUNTER — Telehealth: Payer: Self-pay

## 2023-08-25 NOTE — Progress Notes (Signed)
 Phone contact with pt's son who accompanied her for her breast biopsy and who was the contact person for her biopsy results. Son reports that the breast radiologist contacted him Friday afternoon with his mother's positive result. He also reports that he has spoken with his mother and given her the results. He will have her call to arrange surgical consult. Navigation contact info given.

## 2023-08-25 NOTE — Progress Notes (Signed)
 Phone contact with newly diagnosed pt. Appt for surgical consult made with Dr. Georgiana Shore for March 3,2025 at 9;15. Pt aware of time and location.Encouraged pt to stop by the Cancer Center and pick up a copy of "The Breast  Cancer Treatment Handbook". Navigation contact info given. Encouraged pt to call with quest ions and concerns.

## 2023-08-25 NOTE — Telephone Encounter (Signed)
 Pt called and states, "I had biopsy last Wednesday, 08/20/23. They told me to call and get an appt with Gery Pray. They didn't get it all, and I need to have it taken out before it spreads". I don't see any referral information yet, under media tab. I did confirm It is her left breast. I'm sure she needs to see surgeon. Looks like Cordelia Pen has tried to reach her. I did transfer this call to [facility].

## 2023-09-04 ENCOUNTER — Other Ambulatory Visit: Payer: Self-pay

## 2023-09-04 NOTE — Progress Notes (Signed)
 This information was for the sake of discussion at Tumor  Conference. It is not an official part of the treatment plan.

## 2023-10-16 NOTE — Progress Notes (Signed)
 Phone contact with pt. Pt had lumpectomy yesterday and reports that she is having no pain. Pt is to follow up with Dr. Jonita Neth on April 28th,2025. Encouraged pt to call with questions or concerns.

## 2023-10-31 ENCOUNTER — Other Ambulatory Visit: Payer: Self-pay

## 2023-10-31 NOTE — Progress Notes (Incomplete)
 [facility]  [address] [place],  [place]  [address] [phone]  Clinic Day:  10/31/2023  Referring physician: Lenita Quinones., MD  ASSESSMENT & PLAN:  Assessment:    Plan:  I discussed the assessment and treatment plan with the patient.  The patient was provided an opportunity to ask questions and all were answered.  The patient agreed with the plan and demonstrated an understanding of the instructions.  The patient was advised to call back if the symptoms worsen or if the condition fails to improve as anticipated. To care for your patients  I provided *** minutes of face-to-face time during this this encounter and > 50% was spent counseling as documented under my assessment and plan.    Mandy Baumgartner, MD [facility] DE[facility] OF [facility] [address] [place] [address] Dept: [phone] Dept Fax: [phone]   I, Jasmine Lassiter, am acting as scribe for Mandy Baumgartner, MD  I have reviewed this report as typed by the medical scribe, and it is complete and accurate.  Jasmine M Lassiter   5/2/202510:20 AM  CHIEF COMPLAINT:  CC: ***  Current Treatment:  ***   HISTORY OF PRESENT ILLNESS:  Mandy Spears is a 73 y.o. female with a history of *** who is referred in consultation with {REFERRING PHYSICIAN} for assessment and management.   I have reviewed her chart and materials related to her cancer extensively and collaborated history with the patient. Summary of oncologic history is as follows: Oncology History   No history exists.   INTERVAL HISTORY:     Mandy Spears is seen in the clinic for follow up of her ***. She denies fever, chills, night sweats, or other signs of infection. She denies cardiorespiratory and gastrointestinal issues. She  denies pain. Her appetite is good. Her weight {Weight change:10426}.  HISTORY:   Past Medical History:  Diagnosis Date   Asthma     COPD (chronic obstructive pulmonary disease) (HCC)    Hypertension    Hypothyroidism     Past Surgical History:  Procedure Laterality Date   ABDOMINAL HYSTERECTOMY     TOTAL HIP ARTHROPLASTY Right 02/25/2019   Procedure: TOTAL HIP ARTHROPLASTY ANTERIOR APPROACH;  Surgeon: Claiborne Crew, MD;  Location: [facility];  Service: Orthopedics;  Laterality: Right;  70 mins    No family history on file.  Social History:  reports that she quit smoking about 5 years ago. Her smoking use included cigarettes. She started smoking about 20 years ago. She has a 30 pack-year smoking history. She has never used smokeless tobacco. She reports that she does not drink alcohol and does not use drugs.The patient is {Blank single:19197::"alone","accompanied by"} *** today.  Allergies:  Allergies  Allergen Reactions   Fish Oil Nausea And Vomiting    Current Medications: Current Outpatient Medications  Medication Sig Dispense Refill   albuterol  (VENTOLIN  HFA) 108 (90 Base) MCG/ACT inhaler Inhale 1-2 puffs into the lungs every 6 (six) hours as needed for wheezing or shortness of breath.     atenolol  (TENORMIN ) 50 MG tablet Take 100 mg by mouth daily.     docusate sodium  (COLACE) 100 MG capsule Take 1 capsule (100 mg total) by mouth 2 (two) times daily. 28 capsule 0   ferrous sulfate  (FERROUSUL) 325 (65 FE) MG tablet Take 1 tablet (325 mg total) by mouth 3 (three) times daily with meals for 14 days. 42 tablet 0  HYDROcodone -acetaminophen  (NORCO) 7.5-325 MG tablet Take 1-2 tablets by mouth every 4 (four) hours as needed for moderate pain. 60 tablet 0   levothyroxine  (SYNTHROID ) 88 MCG tablet Take 88 mcg by mouth daily at 6 (six) AM.      losartan -hydrochlorothiazide  (HYZAAR) 50-12.5 MG tablet Take 1 tablet by mouth daily.     methocarbamol  (ROBAXIN ) 500 MG tablet Take 1 tablet (500 mg total) by mouth every 6 (six) hours as needed for muscle spasms. 40 tablet 0   polyethylene glycol (MIRALAX  / GLYCOLAX ) 17 g packet  Take 17 g by mouth 2 (two) times daily. 28 packet 0   pravastatin  (PRAVACHOL ) 80 MG tablet Take 80 mg by mouth daily.     No current facility-administered medications for this visit.    REVIEW OF SYSTEMS:  Review of Systems  Constitutional:  Negative for appetite change, chills, fever and unexpected weight change.  HENT:  Negative.  Negative for lump/mass, mouth sores and sore throat.   Eyes: Negative.   Respiratory: Negative.  Negative for chest tightness, cough, hemoptysis, shortness of breath and wheezing.   Cardiovascular: Negative.  Negative for chest pain, leg swelling and palpitations.  Gastrointestinal: Negative.  Negative for abdominal distention, abdominal pain, blood in stool, constipation, diarrhea, nausea and vomiting.  Endocrine: Negative.   Genitourinary: Negative.  Negative for difficulty urinating, dysuria, frequency and hematuria.   Musculoskeletal:  Negative for arthralgias, back pain, flank pain and gait problem.  Skin: Negative.   Neurological:  Negative for dizziness, extremity weakness, gait problem, headaches, light-headedness, numbness, seizures and speech difficulty.  Hematological: Negative.  Negative for adenopathy. Does not bruise/bleed easily.  Psychiatric/Behavioral: Negative.  Negative for depression and sleep disturbance. The patient is not nervous/anxious.       VITALS:  There were no vitals taken for this visit.  Wt Readings from Last 3 Encounters:  02/25/19 211 lb 10.3 oz (96 kg)  02/23/19 212 lb 3.2 oz (96.3 kg)    There is no height or weight on file to calculate BMI.  Performance status (ECOG): {CHL ONC [id number]  PHYSICAL EXAM:  Physical Exam Vitals and nursing note reviewed.  Constitutional:      General: She is not in acute distress.    Appearance: Normal appearance. She is normal weight.  HENT:     Head: Normocephalic and atraumatic.     Right Ear: Tympanic membrane, ear canal and external ear normal.     Left Ear: Tympanic  membrane, ear canal and external ear normal.  Eyes:     General: No scleral icterus.    Extraocular Movements: Extraocular movements intact.     Conjunctiva/sclera: Conjunctivae normal.     Pupils: Pupils are equal, round, and reactive to light.  Cardiovascular:     Rate and Rhythm: Normal rate and regular rhythm.     Pulses: Normal pulses.     Heart sounds: Normal heart sounds. No murmur heard.    No friction rub. No gallop.  Pulmonary:     Effort: Pulmonary effort is normal. No respiratory distress.     Breath sounds: Normal breath sounds.  Abdominal:     General: Bowel sounds are normal. There is no distension.     Palpations: Abdomen is soft. There is no hepatomegaly, splenomegaly or mass.     Tenderness: There is no abdominal tenderness.  Musculoskeletal:        General: Normal range of motion.     Cervical back: Normal range of motion and neck supple.  Right lower leg: No edema.     Left lower leg: No edema.  Lymphadenopathy:     Cervical: No cervical adenopathy.     Right cervical: No superficial, deep or posterior cervical adenopathy.    Left cervical: No superficial, deep or posterior cervical adenopathy.     Upper Body:     Right upper body: No supraclavicular, axillary or pectoral adenopathy.     Left upper body: No supraclavicular, axillary or pectoral adenopathy.  Skin:    General: Skin is warm and dry.  Neurological:     General: No focal deficit present.     Mental Status: She is alert and oriented to person, place, and time. Mental status is at baseline.  Psychiatric:        Mood and Affect: Mood normal.        Behavior: Behavior normal.        Thought Content: Thought content normal.        Judgment: Judgment normal.    LABS:      Latest Ref Rng & Units 02/26/2019    2:21 AM  CBC  WBC 4.0 - 10.5 K/uL 13.3   Hemoglobin 12.0 - 15.0 g/dL 40.9   Hematocrit 81.1 - 46.0 % 33.2   Platelets 150 - 400 K/uL 211       Latest Ref Rng & Units 02/26/2019     2:21 AM 02/23/2019   10:08 AM  CMP  Glucose 70 - 99 mg/dL 914  97   BUN 8 - 23 mg/dL 25  23   Creatinine 7.82 - 1.00 mg/dL 9.56  2.13   Sodium 086 - 145 mmol/L 137  139   Potassium 3.5 - 5.1 mmol/L 3.8  3.9   Chloride 98 - 111 mmol/L 105  103   CO2 22 - 32 mmol/L 22  26   Calcium 8.9 - 10.3 mg/dL 8.1  9.1    No results found for: "CEA1", "CEA" / No results found for: "CEA1", "CEA" No results found for: "PSA1" No results found for: "VHQ469" No results found for: "CAN125"  No results found for: "TOTALPROTELP", "ALBUMINELP", "A1GS", "A2GS", "BETS", "BETA2SER", "GAMS", "MSPIKE", "SPEI" No results found for: "TIBC", "FERRITIN", "IRONPCTSAT" No results found for: "LDH"  STUDIES:  No results found.     I,Jasmine M Lassiter,acting as a scribe for Mandy Baumgartner, MD.,have documented all relevant documentation on the behalf of Mandy Baumgartner, MD,as directed by  Mandy Baumgartner, MD while in the presence of Mandy Baumgartner, MD.

## 2023-11-05 ENCOUNTER — Inpatient Hospital Stay: Admitting: Oncology

## 2023-11-05 ENCOUNTER — Encounter: Payer: Self-pay | Admitting: Radiation Oncology

## 2023-11-05 ENCOUNTER — Ambulatory Visit
Admission: RE | Admit: 2023-11-05 | Discharge: 2023-11-05 | Disposition: A | Discharge status: Home / Self Care | Source: Ambulatory Visit | Attending: Radiation Oncology | Admitting: Radiation Oncology

## 2023-11-05 VITALS — BP 132/58 | HR 81 | Temp 97.8°F | Resp 18 | Ht 66.14 in | Wt 216.9 lb

## 2023-11-05 DIAGNOSIS — D0512 Intraductal carcinoma in situ of left breast: Secondary | ICD-10-CM

## 2023-11-05 HISTORY — DX: Personal history of malignant neoplasm of breast: Z85.3

## 2023-11-05 HISTORY — DX: Type 1 diabetes mellitus with ketoacidosis without coma: E10.10

## 2023-11-06 NOTE — Progress Notes (Signed)
 Radiation Oncology         [phone] ________________________________  Name: Mandy Spears        MRN: [id number]  Date of Service: 11/05/2023 DOB: 1951/06/24  CC:Poe, Marlan Silva, FNP       REFERRING PHYSICIAN: Danton Dyers, MD   DIAGNOSIS: Ductal carcinoma in situ of the left breast, postlumpectomy   HISTORY OF PRESENT ILLNESS: Mandy Spears is a 73 y.o. female seen at the request of Dr. Jonita Neth.  Late last year, screening mammogram revealed an abnormality in her left breast.  Subsequent imaging confirmed the presence of this mass, and biopsy was ultimately performed, revealing ductal carcinoma in situ.  She was seen in consultation by Dr. Jonita Neth, who performed left breast lumpectomy on 10/15/2023.  Pathology revealed ductal carcinoma in situ, intermediate nuclear grade.  Surgical margins were clear.  Her DCIS was estrogen and progesterone receptor positive.  She reports no difficulties postoperatively.  Consultation is requested regarding the potential role of radiation in her care.    PREVIOUS RADIATION THERAPY: No   PAST MEDICAL HISTORY:  Past Medical History:  Diagnosis Date   Asthma    COPD (chronic obstructive pulmonary disease) (HCC)    Diabetic acetonemia (HCC)    History of right breast cancer    Hypertension    Hypothyroidism        PAST SURGICAL HISTORY: Past Surgical History:  Procedure Laterality Date   ABDOMINAL HYSTERECTOMY     CESAREAN SECTION     INNER EAR SURGERY Left    TOTAL HIP ARTHROPLASTY Right 02/25/2019   Procedure: TOTAL HIP ARTHROPLASTY ANTERIOR APPROACH;  Surgeon: Claiborne Crew, MD;  Location: [facility];  Service: Orthopedics;  Laterality: Right;  70 mins     FAMILY HISTORY:  Family History  Problem Relation Age of Onset   Colon cancer Mother    Heart failure Father    Diabetes Sister    Alzheimer's disease Brother      SOCIAL HISTORY:  reports that she quit smoking about 5 years ago. Her smoking use included cigarettes. She  started smoking about 20 years ago. She has a 30 pack-year smoking history. She has never used smokeless tobacco. She reports that she does not drink alcohol and does not use drugs.   ALLERGIES: Fish oil   MEDICATIONS:  Current Outpatient Medications  Medication Sig Dispense Refill   acetaminophen  (TYLENOL ) 325 MG tablet Take 650 mg by mouth.     albuterol  (VENTOLIN  HFA) 108 (90 Base) MCG/ACT inhaler Inhale 1-2 puffs into the lungs every 6 (six) hours as needed for wheezing or shortness of breath.     amLODipine (NORVASC) 5 MG tablet Take 5 mg by mouth.     atenolol  (TENORMIN ) 50 MG tablet Take 100 mg by mouth daily.     HYDROcodone -acetaminophen  (NORCO) 7.5-325 MG tablet Take 1-2 tablets by mouth every 4 (four) hours as needed for moderate pain. 60 tablet 0   levothyroxine  (SYNTHROID ) 88 MCG tablet Take 88 mcg by mouth daily at 6 (six) AM.      losartan -hydrochlorothiazide  (HYZAAR) 50-12.5 MG tablet Take 1 tablet by mouth daily.     metFORMIN (GLUCOPHAGE-XR) 500 MG 24 hr tablet Take 500 mg by mouth.     omeprazole (PRILOSEC) 20 MG capsule Take 1 capsule by mouth daily.     ondansetron  (ZOFRAN ) 4 MG tablet Take 4 mg by mouth.     pravastatin  (PRAVACHOL ) 80 MG tablet Take 80 mg by mouth daily.     No current  facility-administered medications for this encounter.     REVIEW OF SYSTEMS: On review of systems, the patient reports that she is doing well overall.  She denies any chest pain, shortness of breath, cough, fevers, chills, night sweats, unintended weight changes.  She denies any bowel or bladder disturbances, and denies abdominal pain, nausea or vomiting.  She denies any new musculoskeletal or joint aches or pains.  She reports no pain in either breast.  She denies nipple bleeding or discharge.  A complete review of systems is obtained and is otherwise negative.     PHYSICAL EXAM:  Wt Readings from Last 3 Encounters:  11/05/23 216 lb 14.4 oz (98.4 kg)  02/25/19 211 lb 10.3 oz (96  kg)  02/23/19 212 lb 3.2 oz (96.3 kg)   Temp Readings from Last 3 Encounters:  11/05/23 97.8 F (36.6 C) (Oral)  02/26/19 97.9 F (36.6 C)  02/23/19 98.1 F (36.7 C) (Oral)   BP Readings from Last 3 Encounters:  11/05/23 (!) 132/58  02/26/19 (!) 149/63  02/23/19 (!) 147/60   Pulse Readings from Last 3 Encounters:  11/05/23 81  02/26/19 63  02/23/19 (!) 59   Pain Assessment Pain Score: 0-No pain/10  In general this is a well appearing female in no acute distress.  She's alert and oriented x4 and appropriate throughout the examination. Cardiopulmonary assessment is negative for acute distress and she exhibits normal effort.  No cervical, supraclavicular, or axillary lymphadenopathy is appreciated.  Her extremities are without edema.    ECOG = 0  0 - Asymptomatic (Fully active, able to carry on all predisease activities without restriction)  1 - Symptomatic but completely ambulatory (Restricted in physically strenuous activity but ambulatory and able to carry out work of a light or sedentary nature. For example, light housework, office work)  2 - Symptomatic, <50% in bed during the day (Ambulatory and capable of all self care but unable to carry out any work activities. Up and about more than 50% of waking hours)  3 - Symptomatic, >50% in bed, but not bedbound (Capable of only limited self-care, confined to bed or chair 50% or more of waking hours)  4 - Bedbound (Completely disabled. Cannot carry on any self-care. Totally confined to bed or chair)  5 - Death   Aurea Blossom MM, Creech RH, Tormey DC, et al. [phone]). "Toxicity and response criteria of the [facility]". Am. Hillard Lowes. Oncol. 5 (6): 649-55    LABORATORY DATA:  Lab Results  Component Value Date   WBC 13.3 (H) 02/26/2019   HGB 10.8 (L) 02/26/2019   HCT 33.2 (L) 02/26/2019   MCV 87.4 02/26/2019   PLT 211 02/26/2019   Lab Results  Component Value Date   NA 137 02/26/2019   K 3.8 02/26/2019    CL 105 02/26/2019   CO2 22 02/26/2019   No results found for: "ALT", "AST", "GGT", "ALKPHOS", "BILITOT"        IMPRESSION/PLAN: 1.  The patient is a 73 year old female status post left breast lumpectomy for ductal carcinoma in situ, intermediate nuclear grade.  Surgical margins were clear.  She reports no difficulties postoperatively.  I reviewed with the patient the role of radiation in the management of ductal carcinoma in situ of the breast, postoperative.  I explained the rationale behind adjuvant radiation in her clinical scenario, as well as the potential side effects of radiation.  I explained that these may include, but are not limited to, fatigue, skin irritation, and small  risk of injury to lung, heart, and other normal tissues.  She expressed understanding.  At this time, she does not wish to pursue adjuvant radiation.  I gave her a copy of my business card, and encouraged her to contact me if she has any questions, or if she decides to proceed with adjuvant radiation.   In a visit lasting 60 minutes, greater than 50% of the time was spent face to face discussing the patient's condition, in preparation for the discussion, and coordinating the patient's care.    Oliana Gowens A. Cathren Coaster, MD   **Disclaimer: This note was dictated with voice recognition software. Similar sounding words can inadvertently be transcribed and this note may contain transcription errors which may not have been corrected upon publication of note.**

## 2023-11-13 DIAGNOSIS — D0512 Intraductal carcinoma in situ of left breast: Secondary | ICD-10-CM | POA: Insufficient documentation

## 2023-11-14 ENCOUNTER — Other Ambulatory Visit: Payer: Self-pay | Admitting: Oncology

## 2023-11-14 ENCOUNTER — Encounter: Payer: Self-pay | Admitting: Oncology

## 2023-11-14 ENCOUNTER — Inpatient Hospital Stay

## 2023-11-14 ENCOUNTER — Telehealth: Payer: Self-pay | Admitting: Oncology

## 2023-11-14 ENCOUNTER — Inpatient Hospital Stay: Attending: Oncology | Admitting: Oncology

## 2023-11-14 VITALS — BP 139/44 | HR 96 | Temp 97.9°F | Resp 20 | Ht 66.34 in | Wt 218.5 lb

## 2023-11-14 DIAGNOSIS — Z780 Asymptomatic menopausal state: Secondary | ICD-10-CM

## 2023-11-14 DIAGNOSIS — Z79810 Long term (current) use of selective estrogen receptor modulators (SERMs): Secondary | ICD-10-CM | POA: Diagnosis not present

## 2023-11-14 DIAGNOSIS — D0512 Intraductal carcinoma in situ of left breast: Secondary | ICD-10-CM

## 2023-11-14 DIAGNOSIS — M8589 Other specified disorders of bone density and structure, multiple sites: Secondary | ICD-10-CM | POA: Diagnosis not present

## 2023-11-14 LAB — CBC WITH DIFFERENTIAL (CANCER CENTER ONLY)
Abs Immature Granulocytes: 0.13 10*3/uL — ABNORMAL HIGH (ref 0.00–0.07)
Basophils Absolute: 0.1 10*3/uL (ref 0.0–0.1)
Basophils Relative: 1 %
Eosinophils Absolute: 0.1 10*3/uL (ref 0.0–0.5)
Eosinophils Relative: 1 %
HCT: 37 % (ref 36.0–46.0)
Hemoglobin: 12.6 g/dL (ref 12.0–15.0)
Immature Granulocytes: 1 %
Lymphocytes Relative: 17 %
Lymphs Abs: 1.6 10*3/uL (ref 0.7–4.0)
MCH: 27.5 pg (ref 26.0–34.0)
MCHC: 34.1 g/dL (ref 30.0–36.0)
MCV: 80.8 fL (ref 80.0–100.0)
Monocytes Absolute: 1.4 10*3/uL — ABNORMAL HIGH (ref 0.1–1.0)
Monocytes Relative: 15 %
Neutro Abs: 6.1 10*3/uL (ref 1.7–7.7)
Neutrophils Relative %: 65 %
Platelet Count: 193 10*3/uL (ref 150–400)
RBC: 4.58 MIL/uL (ref 3.87–5.11)
RDW: 14.5 % (ref 11.5–15.5)
WBC Count: 9.3 10*3/uL (ref 4.0–10.5)
nRBC: 0 % (ref 0.0–0.2)

## 2023-11-14 LAB — CMP (CANCER CENTER ONLY)
ALT: 12 U/L (ref 0–44)
AST: 14 U/L — ABNORMAL LOW (ref 15–41)
Albumin: 4.1 g/dL (ref 3.5–5.0)
Alkaline Phosphatase: 137 U/L — ABNORMAL HIGH (ref 38–126)
Anion gap: 15 (ref 5–15)
BUN: 23 mg/dL (ref 8–23)
CO2: 24 mmol/L (ref 22–32)
Calcium: 10.1 mg/dL (ref 8.9–10.3)
Chloride: 101 mmol/L (ref 98–111)
Creatinine: 1.1 mg/dL — ABNORMAL HIGH (ref 0.44–1.00)
GFR, Estimated: 53 mL/min — ABNORMAL LOW (ref >60)
Glucose, Bld: 194 mg/dL — ABNORMAL HIGH (ref 70–99)
Potassium: 4.1 mmol/L (ref 3.5–5.1)
Sodium: 140 mmol/L (ref 135–145)
Total Bilirubin: 0.2 mg/dL (ref 0.0–1.2)
Total Protein: 7.4 g/dL (ref 6.5–8.1)

## 2023-11-14 MED ORDER — RALOXIFENE HCL 60 MG PO TABS: 60.0000 mg | ORAL_TABLET | Freq: Every day | ORAL | 5 refills | Status: DC

## 2023-11-14 NOTE — Telephone Encounter (Signed)
 Patient has been scheduled for follow-up visit per 11/13/23 LOS.  Pt given an appt calendar with date and time.

## 2023-11-14 NOTE — Progress Notes (Signed)
 [facility]  [address] [place],  [place]  [address] [phone]  Clinic Day:  11/14/23  Referring physician: Lenita Quinones., MD  ASSESSMENT & PLAN:  Assessment: Ductal Carcinoma in Situ She had 2 small foci in the lumpectomy specimen and her prognosis is very good. I don't feel she needs further intervention for this cancer as it is a stage 0, Tis N0 M0. However, we discussed the concept of chemoprevention with hormonal therapy and discussed the risks and benefits of Raloxifene . She is agreeable and I will send that prescription in and plan 5 years if she has no unacceptable toxicities.   Osteopenia This is very mild, her last dexa scan was done on 06/04/2023 which showed osteopenia of the spine with a T-score of -1.1 and normal hip with a T-score of 0.2 (left hip) and -0.7 (left femoral neck).   Plan: Mandy Spears is here for clinical evaluation of her newly diagnosed ductal carcinoma in situ of the left breast. She has already had a localized lumpectomy/partial mastectomy and is about 1 month post-op and healing well. She has a follow-up appointment on the 28th. She has already met with Dr. Cathren Spears and discussed the risks and benefits of adjuvant radiation. They agreed on no radiation at this time. I reviewed her pathology report with her and explained that she has a good prognosis. With surgery and clear margins, she will not need chemotherapy or radiation. Due to her tumor being highly ER and PR positive I informed her that it is imperative that she does not take any oral hormone supplement. I then suggested the benefit of chemoprevention with anti-hormonal therapy as this can reduce the risk of future breast cancers by about 50%. I suggested she take a hormone blocker that consists of 1 tablet daily for the next 5 years if tolerated with acceptable toxicities, but this is optional. I discussed Tamoxifen and informed her of the potential side effects such as cancer of the  uterus, risk of blood clots, hot flashes, and menopausal symptoms. I feel the best option would be Raloxifene  as this has less side effects than Tamoxifen other than hot flashes and aching pain. I also mentioned the added advantage of this having a benefit on her bone density, especially with her having osteopenia of the spine. She knows she can stop the Raloxifene  at any time if she has excessive toxicities. We drew CBC and CMP today and I will call her with those results. I will see her back in 3 months for re-evaluation. I discussed the assessment and treatment plan with the patient.  The patient was provided an opportunity to ask questions and all were answered.  The patient agreed with the plan and demonstrated an understanding of the instructions.  The patient was advised to call back if the symptoms worsen or if the condition fails to improve as anticipated.  Thank you for the opportunity for caring for your patients.   I provided 56 minutes of face-to-face time during this this encounter and > 50% was spent counseling as documented under my assessment and plan.   Mandy Baumgartner, MD [facility] - A DEPT OF [facility] [address] [place] [address] Dept: [phone] Dept Fax: [phone]   I, Mandy Spears, am acting as scribe for Mandy Baumgartner, MD  I have reviewed this report as typed by the medical scribe, and it is complete and accurate.  Mandy Spears  Mandy Jacobson, MD   5/23/20256:42 AM  CHIEF COMPLAINT:  CC:  Ductal Carcinoma in Situ of the Left Breast  Current Treatment:  Raloxifene    HISTORY OF PRESENT ILLNESS:  Mandy Spears is a 73 y.o. female with a history of regular mammograms who is referred in consultation by Mandy Spears for assessment and management of newly diagnosed DCIS.  Mandy Spears informed me that an abnormality was found on routine mammogram. Screening bilateral mammogram done on  06/03/2024 revealed a 8 mm slightly angulated oval mass in the left slightly outer breast. This was indeterminate if it was a benign mass or an aggressive lesion so a spot compression, MLO tomography, or targeted ultrasound was recommended. A spot compression of the left breast done on 08/07/2023 revealed an suspicious mass in the left breast so a ultrasound guided biopsy was recommended for further evaluation. The diagnostic mammogram revealed a 6 mm isodense mass in the upper outer quadrant at 2 o'clock, 7cm deep to the nipple, she also had a 4 mm irregular hypoechoic mass at 1 o'clock, 8 cm from the nipple. Biopsy confirmed a solid papillary carcinoma so she was referred to a surgeon for a lumpectomy. Lumpectomy was performed on 10/15/2023 and final pathology revealed a solid papillary, intermediate nuclear grade, ductal carcinoma in situ that is highly ER/PR positive at 99%. DCIS was present in one block with two foci 18 mm apart and all margins were clear. She met with Dr. Cathren Spears in early May and she declined adjuvant radiation at that time.   Mandy Spears's last dexa scan was done on 06/04/2023, which showed osteopenia of the spine with a T-score of -1.1 and normal hip with a T-score of 0.2 (left hip) and -0.7 (left femoral neck). She notes that her last colonoscopy was about over 10 years ago.   I have reviewed her chart and materials related to her cancer extensively and collaborated history with the patient. Summary of oncologic history is as follows: Oncology History  Ductal carcinoma in situ (DCIS) of left breast  10/15/2023 Cancer Staging   Staging form: Breast, AJCC 8th Edition - Clinical stage from 10/15/2023: Stage 0 (cTis (DCIS)(m), cN0, cM0, G2, ER+, PR+, HER2: Not Assessed) - Signed by Mandy Baumgartner, MD on 11/13/2023 Histopathologic type: Intraductal carcinoma, noninfiltrating, NOS Stage prefix: Initial diagnosis Method of lymph node assessment: Clinical Nuclear grade: G2 Multigene  prognostic tests performed: None Histologic grading system: 3 grade system Laterality: Left Multiple tumors: Yes Lymph-vascular invasion (LVI): LVI not present (absent)/not identified Diagnostic confirmation: Positive histology Specimen type: Excision Staged by: Managing physician Menopausal status: Postmenopausal Stage used in treatment planning: Yes National guidelines used in treatment planning: Yes Type of national guideline used in treatment planning: NCCN Staging comments: 2 foci, 18 mm apart   11/13/2023 Initial Diagnosis   Ductal carcinoma in situ (DCIS) of left breast    INTERVAL HISTORY:  Sorah is here for clinical evaluation of her newly diagnosed ductal carcinoma in situ of the left breast. She has already had a localized lumpectomy/partial mastectomy and is about 1 month post-op and healing well. She has a follow-up appointment on the 28th. She has already met with Dr. Cathren Spears and discussed the risks and benefits of adjuvant radiation. They agreed on no radiation at this time. I reviewed her pathology report with her and explained that she has a good prognosis.  With surgery and clear margins, she will not need chemotherapy or radiation. Due to her tumor being highly ER and PR positive, I  informed her that it is imperative that she does not take any oral hormone supplements. I then suggested the benefit of chemoprevention with anti-hormonal therapy as this can reduce the risk of future breast cancers by about 50%. I suggested she take a hormone blocker that consist of 1 tablet daily for the next 5 years minimum if tolerated with no toxicities. I discussed Tamoxifen and informed her of the potential side effects such as cancer of the uterus, risk of blood clots, hot flashes, and menopausal symptoms. I feel the best option would be Raloxifene  as this has less side effects than Tamoxifen other than hot flashes and aching pain. I also mentioned the added advantage of this having a benefit  on her bone density, especially with her having osteopenia of the spine. She knows she can stop the Raloxifene  at any time if she has excessive toxicities. We drew CBC and CMP today and I will call her with those results. I will see her back in 3 months for re-evaluation.   She denies fever, chills, night sweats, or other signs of infection. She denies cardiorespiratory and gastrointestinal issues. She  denies pain. Her appetite is good. Her weight has increased 2 pounds over last week.   HISTORY:   Past Medical History:  Diagnosis Date   Asthma    COPD (chronic obstructive pulmonary disease) (HCC)    COVID    x2   Diabetes mellitus (HCC)    Diabetic acetonemia (HCC)    History of right breast cancer    Hyperlipidemia    Hypertension    Hypothyroidism    Kidney stones     Past Surgical History:  Procedure Laterality Date   CESAREAN SECTION     INNER EAR SURGERY Left    PARTIAL HYSTERECTOMY     due to menorrhagia   TOTAL HIP ARTHROPLASTY Right 02/25/2019   Procedure: TOTAL HIP ARTHROPLASTY ANTERIOR APPROACH;  Surgeon: Claiborne Crew, MD;  Location: [facility];  Service: Orthopedics;  Laterality: Right;  70 mins    Family History  Problem Relation Age of Onset   Colon cancer Mother    Heart failure Father    Diabetes Sister    Breast cancer Sister    Alzheimer's disease Brother    Dementia Brother    Cancer Niece   Sister- Brain Tumor   Social History:  reports that she quit smoking about 5 years ago. Her smoking use included cigarettes. She started smoking about 20 years ago. She has a 30 pack-year smoking history. She has never used smokeless tobacco. She reports that she does not drink alcohol and does not use drugs.The patient is accompanied by her granddaughter today.  Allergies:  Allergies  Allergen Reactions   Fish Oil Nausea And Vomiting, Other (See Comments) and Nausea Only    fish oils    Current Medications: Current Outpatient Medications  Medication Sig  Dispense Refill   albuterol  (VENTOLIN  HFA) 108 (90 Base) MCG/ACT inhaler Inhale 1-2 puffs into the lungs every 6 (six) hours as needed for wheezing or shortness of breath.     amLODipine (NORVASC) 5 MG tablet Take 5 mg by mouth.     levothyroxine  (SYNTHROID ) 100 MCG tablet Take 100 mcg by mouth daily.     losartan -hydrochlorothiazide  (HYZAAR) 50-12.5 MG tablet Take 1 tablet by mouth daily.     metFORMIN (GLUCOPHAGE-XR) 500 MG 24 hr tablet Take 500 mg by mouth.     pravastatin  (PRAVACHOL ) 80 MG tablet Take 80 mg by mouth daily.  raloxifene  (EVISTA ) 60 MG tablet Take 1 tablet (60 mg total) by mouth daily. 30 tablet 5   No current facility-administered medications for this visit.    REVIEW OF SYSTEMS:  Review of Systems  Constitutional: Negative.  Negative for appetite change, chills, diaphoresis, fatigue, fever and unexpected weight change.  HENT:  Negative.  Negative for hearing loss, lump/mass, mouth sores, nosebleeds, sore throat, tinnitus, trouble swallowing and voice change.   Eyes: Negative.   Respiratory: Negative.  Negative for chest tightness, cough, hemoptysis, shortness of breath and wheezing.   Cardiovascular: Negative.  Negative for chest pain, leg swelling and palpitations.  Gastrointestinal: Negative.  Negative for abdominal distention, abdominal pain, blood in stool, constipation, diarrhea, nausea, rectal pain and vomiting.  Endocrine: Negative.   Genitourinary: Negative.  Negative for difficulty urinating, dysuria, frequency and hematuria.   Musculoskeletal: Negative.  Negative for arthralgias, back pain, flank pain, gait problem, myalgias, neck pain and neck stiffness.  Skin: Negative.  Negative for itching, rash and wound.  Neurological:  Negative for dizziness, extremity weakness, gait problem, headaches, light-headedness, numbness, seizures and speech difficulty.  Hematological: Negative.  Negative for adenopathy.  Psychiatric/Behavioral: Negative.  Negative for  confusion, decreased concentration, depression, sleep disturbance and suicidal ideas. The patient is not nervous/anxious.     VITALS:   Blood pressure (!) 139/44, pulse 96, temperature 97.9 F (36.6 C), temperature source Oral, resp. rate 20, height 5' 6.34" (1.685 m), weight 218 lb 8 oz (99.1 kg), SpO2 97%.  Wt Readings from Last 3 Encounters:  11/14/23 218 lb 8 oz (99.1 kg)  11/05/23 216 lb 14.4 oz (98.4 kg)  02/25/19 211 lb 10.3 oz (96 kg)    Body mass index is 34.91 kg/m.  Performance status (ECOG): 1 - Symptomatic but completely ambulatory  PHYSICAL EXAM:  Physical Exam Vitals and nursing note reviewed. Exam conducted with a chaperone present.  Constitutional:      General: She is not in acute distress.    Appearance: Normal appearance. She is normal weight. She is not ill-appearing, toxic-appearing or diaphoretic.  HENT:     Head: Normocephalic and atraumatic.     Right Ear: Tympanic membrane, ear canal and external ear normal. There is no impacted cerumen.     Left Ear: Tympanic membrane, ear canal and external ear normal. There is no impacted cerumen.     Nose: Nose normal. No congestion or rhinorrhea.     Mouth/Throat:     Mouth: Mucous membranes are moist.     Pharynx: Oropharynx is clear. No oropharyngeal exudate or posterior oropharyngeal erythema.  Eyes:     General: No scleral icterus.       Right eye: No discharge.        Left eye: No discharge.     Extraocular Movements: Extraocular movements intact.     Conjunctiva/sclera: Conjunctivae normal.     Pupils: Pupils are equal, round, and reactive to light.  Neck:     Vascular: No carotid bruit.  Cardiovascular:     Rate and Rhythm: Normal rate and regular rhythm.     Pulses: Normal pulses.     Heart sounds: Normal heart sounds. No murmur heard.    No friction rub. No gallop.  Pulmonary:     Effort: Pulmonary effort is normal. No respiratory distress.     Breath sounds: Normal breath sounds. No stridor. No  wheezing, rhonchi or rales.  Chest:     Chest wall: No tenderness.     Comments: Healing incision  in the upper outer quadrant of the left breast but she does have some slight erythema and warmth of the entire central left breast Abdominal:     General: Bowel sounds are normal. There is no distension.     Palpations: Abdomen is soft. There is no hepatomegaly, splenomegaly or mass.     Tenderness: There is no abdominal tenderness. There is no right CVA tenderness, left CVA tenderness, guarding or rebound.     Hernia: No hernia is present.  Musculoskeletal:        General: No swelling, tenderness, deformity or signs of injury. Normal range of motion.     Cervical back: Normal range of motion and neck supple. No rigidity or tenderness.     Right lower leg: No edema.     Left lower leg: No edema.     Right ankle: Swelling (mild) present.     Left ankle: Swelling (mild) present.  Lymphadenopathy:     Cervical: No cervical adenopathy.     Right cervical: No superficial, deep or posterior cervical adenopathy.    Left cervical: No superficial, deep or posterior cervical adenopathy.     Upper Body:     Right upper body: No supraclavicular, axillary or pectoral adenopathy.     Left upper body: No supraclavicular, axillary or pectoral adenopathy.  Skin:    General: Skin is warm and dry.     Coloration: Skin is not jaundiced or pale.     Findings: No bruising, erythema, lesion or rash.  Neurological:     General: No focal deficit present.     Mental Status: She is alert and oriented to person, place, and time. Mental status is at baseline.     Cranial Nerves: No cranial nerve deficit.     Sensory: No sensory deficit.     Motor: No weakness.     Coordination: Coordination normal.     Gait: Gait normal.     Deep Tendon Reflexes: Reflexes normal.  Psychiatric:        Mood and Affect: Mood normal.        Behavior: Behavior normal.        Thought Content: Thought content normal.         Judgment: Judgment normal.    LABS:      Latest Ref Rng & Units 11/14/2023   10:20 AM 02/26/2019    2:21 AM  CBC  WBC 4.0 - 10.5 K/uL 9.3  13.3   Hemoglobin 12.0 - 15.0 g/dL 16.1  09.6   Hematocrit 36.0 - 46.0 % 37.0  33.2   Platelets 150 - 400 K/uL 193  211       Latest Ref Rng & Units 11/14/2023   10:20 AM 02/26/2019    2:21 AM 02/23/2019   10:08 AM  CMP  Glucose 70 - 99 mg/dL 045  409  97   BUN 8 - 23 mg/dL 23  25  23    Creatinine 0.44 - 1.00 mg/dL 8.11  9.14  7.82   Sodium 135 - 145 mmol/L 140  137  139   Potassium 3.5 - 5.1 mmol/L 4.1  3.8  3.9   Chloride 98 - 111 mmol/L 101  105  103   CO2 22 - 32 mmol/L 24  22  26    Calcium 8.9 - 10.3 mg/dL 95.6  8.1  9.1   Total Protein 6.5 - 8.1 g/dL 7.4     Total Bilirubin 0.0 - 1.2 mg/dL 0.2  Alkaline Phos 38 - 126 U/L 137     AST 15 - 41 U/L 14     ALT 0 - 44 U/L 12     No results found for: "CEA1", "CEA" / No results found for: "CEA1", "CEA" No results found for: "PSA1" No results found for: "NUU725" No results found for: "CAN125"  No results found for: "TOTALPROTELP", "ALBUMINELP", "A1GS", "A2GS", "BETS", "BETA2SER", "GAMS", "MSPIKE", "SPEI" No results found for: "TIBC", "FERRITIN", "IRONPCTSAT" No results found for: "LDH"  STUDIES:  Pathology: 10/15/2023 Breast, lumpectomy, left breast upper outer quadrant - Ductal Carcinoma in Situ, Intermediate nuclear grade, solid papillary and cribriform patterns, region of ribbon clip biopsy reaction - DCIS present in one block with two foci 18mm apart - Fibrocystic changes with atypical ductal hyperplasia - No invasive carcinoma identified  - DCIS 3mm from posterior margin - ER positive- 99% - PR positive- 99% - All margins are negative  Biopsy Pathology: 08/20/2023 - Solid Papillary carcinoma, intermediate nuclear grade - No invasive carcinoma identified - Calcifications not identified  Exam: 08/07/2023 2D and 3D Spot Compression of the Left  Breast Impression: Suspicious mamm in the left breast Recommendation - Ultrasound guided biopsy  Exam: 06/03/2024 Digital Screening Bilateral Mammogram with Tomosynthesis Impression: 8mm slightly angulated oval mass in the left slightly outer breast at 1-3 o'clock, 7cm from the nipple. This may represent a benign mass such as a lobulated cyst but a more aggressive lesion cannot entirely be excluded.  Recommendation - Spot compression of the left CC and MLO tomography. Targeted left ultrasound.        I,Mandy M Spears,acting as a scribe for Mandy Baumgartner, MD.,have documented all relevant documentation on the behalf of Mandy Baumgartner, MD,as directed by  Mandy Baumgartner, MD while in the presence of Mandy Baumgartner, MD.

## 2023-11-14 NOTE — Progress Notes (Signed)
 Face to face contact with pt in [facility]. Pt is her for Med Onc consult with Dr. Almer Jacobson. Pt has Rad Onc consult with Dr. Cathren Coaster on 11/05/23 but declined radiation therapy. Pt reports that she has done well with her surgery. A copy of "The Breast Cancer Treatment Handbook" given to the patient today. Encouraged to call with questions or concerns.

## 2023-11-21 ENCOUNTER — Telehealth: Payer: Self-pay

## 2023-11-21 DIAGNOSIS — Z780 Asymptomatic menopausal state: Secondary | ICD-10-CM | POA: Insufficient documentation

## 2023-11-21 DIAGNOSIS — M8580 Other specified disorders of bone density and structure, unspecified site: Secondary | ICD-10-CM | POA: Insufficient documentation

## 2023-11-21 NOTE — Telephone Encounter (Signed)
 Patient aware last labs looked good per Dr. Almer Jacobson.

## 2023-11-21 NOTE — Telephone Encounter (Signed)
 Attempted to contact patient. No answer.

## 2023-11-21 NOTE — Telephone Encounter (Signed)
-----   Message from Nolia Baumgartner sent at 11/21/2023  7:06 AM EDT ----- Regarding: call Tell her labs looked good

## 2023-11-21 NOTE — Telephone Encounter (Signed)
-----   Message from Mandy Spears sent at 11/21/2023  7:06 AM EDT ----- Regarding: call Tell her labs looked good

## 2023-11-25 ENCOUNTER — Telehealth: Payer: Self-pay

## 2023-11-25 NOTE — Telephone Encounter (Signed)
 Attempted to contact patient. No answer and no VM.

## 2023-11-25 NOTE — Telephone Encounter (Signed)
-----   Message from Nolia Baumgartner sent at 11/21/2023  7:06 AM EDT ----- Regarding: call Tell her labs looked good

## 2024-02-12 NOTE — Progress Notes (Signed)
 [facility]  [address] [place],  [place]  [address] [phone]  Clinic Day:  02/13/24  Referring physician: Carlon Mitzie SAUNDERS, FNP  ASSESSMENT & PLAN:  Assessment: Ductal Carcinoma in Situ She had 2 small foci in the lumpectomy specimen and her prognosis is very good. I don't feel she needs further intervention for this cancer as it is a stage 0, Tis N0 M0. I have placed her on raloxifene  for chemoprevention and she is tolerating it well.   Osteopenia This is very mild, her last dexa scan was done on 06/04/2023 which showed osteopenia of the spine with a T-score of -1.1 and normal hip with a T-score of 0.2 (left hip) and -0.7 (left femoral neck).   Plan: When I saw her in the Spring, I recommended raloxifene  for chemoprevention. She is taking this daily without difficulty. Patient states that she feels well and has no complaints of pain.  Her PCP appointment was canceled but she has found a new provider in [place] whom she will see 02/25/2024. She has a WBC of 8.8, hemoglobin of 12.2, and platelet count of 180,000. Her CMP is normal other than a mildly elevated creatinine 1.17 , and an elevated alkaline phosphatase of 133. Her glucose is elevated at 261 however she has not been fasting today. She affirms that she is drinking fluids often. She is due for a screening mammogram in December and Dr. Bert will schedule this and she will follow-up with him. She is also due for a colonoscopy and I recommended that she get that through Dr. Bert or Dr. Charlanne. I will see her back in 6 months with physical exam. I discussed the assessment and treatment plan with the patient.  The patient was provided an opportunity to ask questions and all were answered.  The patient agreed with the plan and demonstrated an understanding of the instructions.  The patient was advised to call back if the symptoms worsen or if the condition fails to improve as anticipated.   I provided 14 minutes of  face-to-face time during this this encounter and > 50% was spent counseling as documented under my assessment and plan.   Mandy Spears [facility] A DEPT OF [facility] [address] [place] [address] Dept: [phone] Dept Fax: [phone]   CHIEF COMPLAINT:  CC:  Ductal Carcinoma in Situ of the Left Breast  Current Treatment:  Raloxifene    HISTORY OF PRESENT ILLNESS:  Mandy Spears is a 73 y.o. female with a history of regular mammograms who is referred in consultation by Dr. Ozell Bert for assessment and management of newly diagnosed DCIS.  [name] informed me that an abnormality was found on routine mammogram. Screening bilateral mammogram done on 06/04/2023 revealed a 8 mm slightly angulated oval mass in the left slightly outer breast. This was indeterminate if it was a benign mass or an aggressive lesion so a spot compression, MLO tomography, or targeted ultrasound was recommended. A spot compression of the left breast done on 08/07/2023 revealed an suspicious mass in the left breast so a ultrasound guided biopsy was recommended for further evaluation. The diagnostic mammogram revealed a 6 mm isodense mass in the upper outer quadrant at 2 o'clock, 7cm deep to the nipple, she also had a 4 mm irregular hypoechoic mass at 1 o'clock, 8 cm from the nipple. Biopsy confirmed a solid papillary carcinoma so she was referred to a surgeon  for a lumpectomy. Lumpectomy was performed on 10/15/2023 and final pathology revealed a solid papillary, intermediate nuclear grade, ductal carcinoma in situ that is highly ER/PR positive at 99%. DCIS was present in one block with two foci 18 mm apart and all margins were clear. She met with Dr. Jomarie in early May and she declined adjuvant radiation at that time.   Mandy Spears's last dexa scan was done on 06/04/2023, which showed osteopenia of the spine with a T-score of -1.1 and normal hip with  a T-score of 0.2 (left hip) and -0.7 (left femoral neck). She notes that her last colonoscopy was about over 10 years ago.   I have reviewed her chart and materials related to her cancer extensively and collaborated history with the patient. Summary of oncologic history is as follows: Oncology History  Ductal carcinoma in situ (DCIS) of left breast  10/15/2023 Cancer Staging   Staging form: Breast, AJCC 8th Edition - Clinical stage from 10/15/2023: Stage 0 (cTis (DCIS)(m), cN0, cM0, G2, ER+, PR+, HER2: Not Assessed) - Signed by Cornelius Mandy DEL, Spears on 11/13/2023 Histopathologic type: Intraductal carcinoma, noninfiltrating, NOS Stage prefix: Initial diagnosis Method of lymph node assessment: Clinical Nuclear grade: G2 Multigene prognostic tests performed: None Histologic grading system: 3 grade system Laterality: Left Multiple tumors: Yes Lymph-vascular invasion (LVI): LVI not present (absent)/not identified Diagnostic confirmation: Positive histology Specimen type: Excision Staged by: Managing physician Menopausal status: Postmenopausal Stage used in treatment planning: Yes National guidelines used in treatment planning: Yes Type of national guideline used in treatment planning: NCCN Staging comments: 2 foci, 18 mm apart   11/13/2023 Initial Diagnosis   Ductal carcinoma in situ (DCIS) of left breast    INTERVAL HISTORY:  [name] is here for clinical evaluation of her ductal carcinoma in situ of the left breast which was diagnosed in February of 2025. When I saw her in the Spring, I recommended raloxifene  for chemoprevention. She is still taking 60 mg raloxifene  daily without difficulty. Patient states that she feels well and has no complaints of pain.  Her PCP appointment was canceled but she has found a new provider in [place] whom she will see 02/25/2024. She has a WBC of 8.8, hemoglobin of 12.2, and platelet count of 180,000. Her CMP is normal other than a mildly elevated creatinine  1.17 up from 1.10, and an elevated alkaline phosphatase of 133. Her glucose is elevated at 261 however she has not been fasting today. She affirms that she is drinking fluids often. She is due for a screening mammogram in December and Dr. Bert will schedule this and she will follow-up with him. She is also due for a colonoscopy and I recommended that she get that through Dr. Bert or Dr. Charlanne. I will see her back in 6 months with physical exam.  She denies fever, chills, night sweats, or other signs of infection. She denies cardiorespiratory and gastrointestinal issues. She  denies pain. Her appetite is normal and Her weight has increased 1 pounds over last 3 months.She is accompanied today in the office by her daughter.  HISTORY:   Past Medical History:  Diagnosis Date   Asthma    COPD (chronic obstructive pulmonary disease) (HCC)    COVID    x2   Diabetes mellitus (HCC)    Diabetic acetonemia (HCC)    History of right breast cancer    Hyperlipidemia    Hypertension    Hypothyroidism    Kidney stones     Past Surgical History:  Procedure Laterality Date   CESAREAN SECTION     INNER EAR SURGERY Left    PARTIAL HYSTERECTOMY     due to menorrhagia   TOTAL HIP ARTHROPLASTY Right 02/25/2019   Procedure: TOTAL HIP ARTHROPLASTY ANTERIOR APPROACH;  Surgeon: Ernie Cough, Spears;  Location: [facility];  Service: Orthopedics;  Laterality: Right;  70 mins    Family History  Problem Relation Age of Onset   Colon cancer Mother    Heart failure Father    Diabetes Sister    Breast cancer Sister    Alzheimer's disease Brother    Dementia Brother    Cancer Niece   Sister- Brain Tumor   Social History:  reports that she quit smoking about 6 years ago. Her smoking use included cigarettes. She started smoking about 21 years ago. She has a 30 pack-year smoking history. She has never used smokeless tobacco. She reports that she does not drink alcohol and does not use drugs.The patient is  accompanied by her granddaughter today.  Allergies:  Allergies  Allergen Reactions   Fish Oil Nausea And Vomiting, Other (See Comments) and Nausea Only    fish oils    Current Medications: Current Outpatient Medications  Medication Sig Dispense Refill   albuterol  (VENTOLIN  HFA) 108 (90 Base) MCG/ACT inhaler Inhale 1-2 puffs into the lungs every 6 (six) hours as needed for wheezing or shortness of breath.     amLODipine (NORVASC) 5 MG tablet Take 5 mg by mouth.     levothyroxine  (SYNTHROID ) 100 MCG tablet Take 100 mcg by mouth daily.     losartan -hydrochlorothiazide  (HYZAAR) 50-12.5 MG tablet Take 1 tablet by mouth daily.     metFORMIN (GLUCOPHAGE-XR) 500 MG 24 hr tablet Take 500 mg by mouth.     pravastatin  (PRAVACHOL ) 80 MG tablet Take 80 mg by mouth daily.     raloxifene  (EVISTA ) 60 MG tablet Take 1 tablet (60 mg total) by mouth daily. 30 tablet 5   No current facility-administered medications for this visit.    REVIEW OF SYSTEMS:  Review of Systems  Constitutional: Negative.  Negative for appetite change, chills, diaphoresis, fatigue, fever and unexpected weight change.  HENT:  Negative.  Negative for hearing loss, lump/mass, mouth sores, nosebleeds, sore throat, tinnitus, trouble swallowing and voice change.   Eyes: Negative.   Respiratory: Negative.  Negative for chest tightness, cough, hemoptysis, shortness of breath and wheezing.   Cardiovascular: Negative.  Negative for chest pain, leg swelling and palpitations.  Gastrointestinal: Negative.  Negative for abdominal distention, abdominal pain, blood in stool, constipation, diarrhea, nausea, rectal pain and vomiting.  Endocrine: Negative.   Genitourinary: Negative.  Negative for difficulty urinating, dysuria, frequency and hematuria.   Musculoskeletal: Negative.  Negative for arthralgias, back pain, flank pain, gait problem, myalgias, neck pain and neck stiffness.  Skin: Negative.  Negative for itching, rash and wound.   Neurological:  Negative for dizziness, extremity weakness, gait problem, headaches, light-headedness, numbness, seizures and speech difficulty.  Hematological: Negative.  Negative for adenopathy.  Psychiatric/Behavioral: Negative.  Negative for confusion, decreased concentration, depression, sleep disturbance and suicidal ideas. The patient is not nervous/anxious.     VITALS:   Blood pressure (!) 142/65, pulse 90, temperature 97.7 F (36.5 C), temperature source Oral, resp. rate 18, height 5' 6.34 (1.685 m), weight 219 lb 14.4 oz (99.7 kg), SpO2 98%.  Wt Readings from Last 3 Encounters:  02/13/24 219 lb 14.4 oz (99.7 kg)  11/14/23 218 lb 8 oz (99.1 kg)  11/05/23 216  lb 14.4 oz (98.4 kg)    Body mass index is 35.13 kg/m.  Performance status (ECOG): 0 - Asymptomatic  PHYSICAL EXAM:  Physical Exam Vitals and nursing note reviewed. Exam conducted with a chaperone present.  Constitutional:      General: She is not in acute distress.    Appearance: Normal appearance. She is normal weight. She is not ill-appearing, toxic-appearing or diaphoretic.  HENT:     Head: Normocephalic and atraumatic.     Right Ear: Tympanic membrane, ear canal and external ear normal. There is no impacted cerumen.     Left Ear: Tympanic membrane, ear canal and external ear normal. There is no impacted cerumen.     Nose: Nose normal. No congestion or rhinorrhea.     Mouth/Throat:     Mouth: Mucous membranes are moist.     Pharynx: Oropharynx is clear. No oropharyngeal exudate or posterior oropharyngeal erythema.     Comments: Mild sore on left lower lip Eyes:     General: No scleral icterus.       Right eye: No discharge.        Left eye: No discharge.     Extraocular Movements: Extraocular movements intact.     Conjunctiva/sclera: Conjunctivae normal.     Pupils: Pupils are equal, round, and reactive to light.  Neck:     Vascular: No carotid bruit.  Cardiovascular:     Rate and Rhythm: Normal rate and  regular rhythm.     Pulses: Normal pulses.     Heart sounds: Normal heart sounds. No murmur heard.    No friction rub. No gallop.  Pulmonary:     Effort: Pulmonary effort is normal. No respiratory distress.     Breath sounds: Normal breath sounds. No stridor. No wheezing, rhonchi or rales.  Chest:     Chest wall: No tenderness.     Comments: Well healed scar upper outer quadrant of left breast with tiny amount of residual coloration in central left breast No masses in either breast Abdominal:     General: Bowel sounds are normal. There is no distension.     Palpations: Abdomen is soft. There is no hepatomegaly, splenomegaly or mass.     Tenderness: There is no abdominal tenderness. There is no right CVA tenderness, left CVA tenderness, guarding or rebound.     Hernia: No hernia is present.  Musculoskeletal:        General: No swelling, tenderness, deformity or signs of injury. Normal range of motion.     Cervical back: Normal range of motion and neck supple. No rigidity or tenderness.     Right lower leg: Edema (mild) present.     Left lower leg: Edema (mild) present.     Right ankle: Swelling (mild) present.     Left ankle: Swelling (mild) present.  Lymphadenopathy:     Cervical: No cervical adenopathy.     Right cervical: No superficial, deep or posterior cervical adenopathy.    Left cervical: No superficial, deep or posterior cervical adenopathy.     Upper Body:     Right upper body: No supraclavicular, axillary or pectoral adenopathy.     Left upper body: No supraclavicular, axillary or pectoral adenopathy.  Skin:    General: Skin is warm and dry.     Coloration: Skin is not jaundiced or pale.     Findings: No bruising, erythema, lesion or rash.  Neurological:     General: No focal deficit present.  Mental Status: She is alert and oriented to person, place, and time. Mental status is at baseline.     Cranial Nerves: No cranial nerve deficit.     Sensory: No sensory  deficit.     Motor: No weakness.     Coordination: Coordination normal.     Gait: Gait normal.     Deep Tendon Reflexes: Reflexes normal.  Psychiatric:        Mood and Affect: Mood normal.        Behavior: Behavior normal.        Thought Content: Thought content normal.        Judgment: Judgment normal.    LABS:      Latest Ref Rng & Units 02/13/2024    9:44 AM 11/14/2023   10:20 AM 02/26/2019    2:21 AM  CBC  WBC 4.0 - 10.5 K/uL 8.8  9.3  13.3   Hemoglobin 12.0 - 15.0 g/dL 87.7  87.3  89.1   Hematocrit 36.0 - 46.0 % 37.2  37.0  33.2   Platelets 150 - 400 K/uL 180  193  211       Latest Ref Rng & Units 02/13/2024    9:44 AM 11/14/2023   10:20 AM 02/26/2019    2:21 AM  CMP  Glucose 70 - 99 mg/dL 738  805  782   BUN 8 - 23 mg/dL 22  23  25    Creatinine 0.44 - 1.00 mg/dL 8.82  8.89  8.77   Sodium 135 - 145 mmol/L 138  140  137   Potassium 3.5 - 5.1 mmol/L 4.2  4.1  3.8   Chloride 98 - 111 mmol/L 100  101  105   CO2 22 - 32 mmol/L 21  24  22    Calcium 8.9 - 10.3 mg/dL 8.9  89.8  8.1   Total Protein 6.5 - 8.1 g/dL 7.5  7.4    Total Bilirubin 0.0 - 1.2 mg/dL 0.3  0.2    Alkaline Phos 38 - 126 U/L 133  137    AST 15 - 41 U/L 16  14    ALT 0 - 44 U/L 13  12    No results found for: CEA1, CEA / No results found for: CEA1, CEA No results found for: PSA1 No results found for: CAN199 No results found for: CAN125  No results found for: TOTALPROTELP, ALBUMINELP, A1GS, A2GS, BETS, BETA2SER, GAMS, MSPIKE, SPEI No results found for: TIBC, FERRITIN, IRONPCTSAT No results found for: LDH  STUDIES:  Pathology: 10/15/2023 Breast, lumpectomy, left breast upper outer quadrant - Ductal Carcinoma in Situ, Intermediate nuclear grade, solid papillary and cribriform patterns, region of ribbon clip biopsy reaction - DCIS present in one block with two foci 18mm apart - Fibrocystic changes with atypical ductal hyperplasia - No invasive carcinoma identified   - DCIS 3mm from posterior margin - ER positive- 99% - PR positive- 99% - All margins are negative  Biopsy Pathology: 08/20/2023 - Solid Papillary carcinoma, intermediate nuclear grade - No invasive carcinoma identified - Calcifications not identified  Exam: 08/07/2023 2D and 3D Spot Compression of the Left Breast Impression: Suspicious mamm in the left breast Recommendation - Ultrasound guided biopsy  Exam: 06/03/2024 Digital Screening Bilateral Mammogram with Tomosynthesis Impression: 8mm slightly angulated oval mass in the left slightly outer breast at 1-3 o'clock, 7cm from the nipple. This may represent a benign mass such as a lobulated cyst but a more aggressive lesion cannot entirely be excluded.  Recommendation -  Spot compression of the left CC and MLO tomography. Targeted left ultrasound.    I,Bedelia Pong H Labradford Schnitker,acting as a scribe for Mandy Spears.,have documented all relevant documentation on the behalf of Mandy Spears,as directed by  Mandy Spears while in the presence of Mandy Spears.

## 2024-02-13 ENCOUNTER — Other Ambulatory Visit: Payer: Self-pay

## 2024-02-13 ENCOUNTER — Telehealth: Payer: Self-pay | Admitting: Oncology

## 2024-02-13 ENCOUNTER — Inpatient Hospital Stay: Attending: Oncology | Admitting: Oncology

## 2024-02-13 ENCOUNTER — Encounter: Payer: Self-pay | Admitting: Oncology

## 2024-02-13 ENCOUNTER — Inpatient Hospital Stay

## 2024-02-13 ENCOUNTER — Other Ambulatory Visit: Payer: Self-pay | Admitting: Oncology

## 2024-02-13 VITALS — BP 142/65 | HR 90 | Temp 97.7°F | Resp 18 | Ht 66.34 in | Wt 219.9 lb

## 2024-02-13 DIAGNOSIS — D0512 Intraductal carcinoma in situ of left breast: Secondary | ICD-10-CM | POA: Diagnosis present

## 2024-02-13 DIAGNOSIS — M8589 Other specified disorders of bone density and structure, multiple sites: Secondary | ICD-10-CM | POA: Insufficient documentation

## 2024-02-13 DIAGNOSIS — Z79890 Hormone replacement therapy (postmenopausal): Secondary | ICD-10-CM | POA: Insufficient documentation

## 2024-02-13 DIAGNOSIS — Z8249 Family history of ischemic heart disease and other diseases of the circulatory system: Secondary | ICD-10-CM | POA: Insufficient documentation

## 2024-02-13 DIAGNOSIS — Z87891 Personal history of nicotine dependence: Secondary | ICD-10-CM | POA: Insufficient documentation

## 2024-02-13 DIAGNOSIS — Z803 Family history of malignant neoplasm of breast: Secondary | ICD-10-CM | POA: Diagnosis not present

## 2024-02-13 DIAGNOSIS — Z800 Family history of malignant neoplasm of digestive organs: Secondary | ICD-10-CM | POA: Insufficient documentation

## 2024-02-13 DIAGNOSIS — Z79810 Long term (current) use of selective estrogen receptor modulators (SERMs): Secondary | ICD-10-CM | POA: Diagnosis not present

## 2024-02-13 DIAGNOSIS — Z833 Family history of diabetes mellitus: Secondary | ICD-10-CM | POA: Diagnosis not present

## 2024-02-13 DIAGNOSIS — Z79899 Other long term (current) drug therapy: Secondary | ICD-10-CM | POA: Diagnosis not present

## 2024-02-13 LAB — CBC WITH DIFFERENTIAL (CANCER CENTER ONLY)
Abs Immature Granulocytes: 0.13 K/uL — ABNORMAL HIGH (ref 0.00–0.07)
Basophils Absolute: 0.1 K/uL (ref 0.0–0.1)
Basophils Relative: 1 %
Eosinophils Absolute: 0.1 K/uL (ref 0.0–0.5)
Eosinophils Relative: 1 %
HCT: 37.2 % (ref 36.0–46.0)
Hemoglobin: 12.2 g/dL (ref 12.0–15.0)
Immature Granulocytes: 2 %
Lymphocytes Relative: 14 %
Lymphs Abs: 1.2 K/uL (ref 0.7–4.0)
MCH: 26.8 pg (ref 26.0–34.0)
MCHC: 32.8 g/dL (ref 30.0–36.0)
MCV: 81.8 fL (ref 80.0–100.0)
Monocytes Absolute: 1.1 K/uL — ABNORMAL HIGH (ref 0.1–1.0)
Monocytes Relative: 13 %
Neutro Abs: 6.2 K/uL (ref 1.7–7.7)
Neutrophils Relative %: 69 %
Platelet Count: 180 K/uL (ref 150–400)
RBC: 4.55 MIL/uL (ref 3.87–5.11)
RDW: 14.6 % (ref 11.5–15.5)
WBC Count: 8.8 K/uL (ref 4.0–10.5)
nRBC: 0 % (ref 0.0–0.2)

## 2024-02-13 LAB — CMP (CANCER CENTER ONLY)
ALT: 13 U/L (ref 0–44)
AST: 16 U/L (ref 15–41)
Albumin: 4.2 g/dL (ref 3.5–5.0)
Alkaline Phosphatase: 133 U/L — ABNORMAL HIGH (ref 38–126)
Anion gap: 17 — ABNORMAL HIGH (ref 5–15)
BUN: 22 mg/dL (ref 8–23)
CO2: 21 mmol/L — ABNORMAL LOW (ref 22–32)
Calcium: 8.9 mg/dL (ref 8.9–10.3)
Chloride: 100 mmol/L (ref 98–111)
Creatinine: 1.17 mg/dL — ABNORMAL HIGH (ref 0.44–1.00)
GFR, Estimated: 49 mL/min — ABNORMAL LOW (ref >60)
Glucose, Bld: 261 mg/dL — ABNORMAL HIGH (ref 70–99)
Potassium: 4.2 mmol/L (ref 3.5–5.1)
Sodium: 138 mmol/L (ref 135–145)
Total Bilirubin: 0.3 mg/dL (ref 0.0–1.2)
Total Protein: 7.5 g/dL (ref 6.5–8.1)

## 2024-02-13 NOTE — Progress Notes (Signed)
 Face to face with pt in lobby. Pt is here for Med Onc follow up. Pt reports that she has done well with  the anti hormonal medication and denies any problems at present time.

## 2024-02-13 NOTE — Telephone Encounter (Signed)
 Contacted pt to schedule an appt per 02/13/24 but per person answering phone pt was not home.  Requested I call back at a later time to schedule.

## 2024-02-17 NOTE — Telephone Encounter (Signed)
 Patient has been scheduled. Aware of appt date and time.

## 2024-03-08 ENCOUNTER — Other Ambulatory Visit: Payer: Self-pay | Admitting: Oncology

## 2024-03-08 DIAGNOSIS — D0512 Intraductal carcinoma in situ of left breast: Secondary | ICD-10-CM

## 2024-05-09 ENCOUNTER — Other Ambulatory Visit: Payer: Self-pay | Admitting: Oncology

## 2024-05-09 DIAGNOSIS — D0512 Intraductal carcinoma in situ of left breast: Secondary | ICD-10-CM

## 2024-08-17 ENCOUNTER — Ambulatory Visit: Admitting: Oncology
